# Patient Record
Sex: Male | Born: 1975 | Race: Black or African American | Hispanic: No | Marital: Single | Smoking: Former smoker
Health system: Southern US, Community
[De-identification: ages and names within clinical notes are randomized; demographics above are authoritative.]

## PROBLEM LIST (undated history)

## (undated) DIAGNOSIS — I10 Essential (primary) hypertension: Secondary | ICD-10-CM

---

## 2004-12-20 ENCOUNTER — Emergency Department (HOSPITAL_COMMUNITY): Admission: EM | Admit: 2004-12-20 | Discharge: 2004-12-20 | Payer: Self-pay | Admitting: Emergency Medicine

## 2006-08-14 ENCOUNTER — Emergency Department (HOSPITAL_COMMUNITY): Admission: EM | Admit: 2006-08-14 | Discharge: 2006-08-14 | Payer: Self-pay | Admitting: Emergency Medicine

## 2006-08-26 ENCOUNTER — Emergency Department (HOSPITAL_COMMUNITY): Admission: EM | Admit: 2006-08-26 | Discharge: 2006-08-26 | Payer: Self-pay | Admitting: Family Medicine

## 2009-11-12 ENCOUNTER — Emergency Department (HOSPITAL_COMMUNITY): Admission: EM | Admit: 2009-11-12 | Discharge: 2009-11-12 | Payer: Self-pay | Admitting: Family Medicine

## 2010-10-02 ENCOUNTER — Inpatient Hospital Stay (INDEPENDENT_AMBULATORY_CARE_PROVIDER_SITE_OTHER)
Admission: RE | Admit: 2010-10-02 | Discharge: 2010-10-02 | Disposition: A | Discharge status: Home / Self Care | Payer: Self-pay | Source: Ambulatory Visit | Attending: Emergency Medicine | Admitting: Emergency Medicine

## 2010-10-02 DIAGNOSIS — L988 Other specified disorders of the skin and subcutaneous tissue: Secondary | ICD-10-CM

## 2010-12-31 ENCOUNTER — Inpatient Hospital Stay (INDEPENDENT_AMBULATORY_CARE_PROVIDER_SITE_OTHER)
Admission: RE | Admit: 2010-12-31 | Discharge: 2010-12-31 | Disposition: A | Discharge status: Home / Self Care | Payer: Self-pay | Source: Ambulatory Visit | Attending: Family Medicine | Admitting: Family Medicine

## 2010-12-31 DIAGNOSIS — L989 Disorder of the skin and subcutaneous tissue, unspecified: Secondary | ICD-10-CM

## 2010-12-31 DIAGNOSIS — I10 Essential (primary) hypertension: Secondary | ICD-10-CM

## 2010-12-31 LAB — POCT URINALYSIS DIP (DEVICE)
Glucose, UA: NEGATIVE mg/dL
Hgb urine dipstick: NEGATIVE
Ketones, ur: NEGATIVE mg/dL
Nitrite: NEGATIVE
Protein, ur: 30 mg/dL — AB
Specific Gravity, Urine: 1.025 (ref 1.005–1.030)

## 2010-12-31 LAB — POCT I-STAT, CHEM 8
BUN: 10 mg/dL (ref 6–23)
Calcium, Ion: 1.18 mmol/L (ref 1.12–1.32)
HCT: 52 % (ref 39.0–52.0)
Hemoglobin: 17.7 g/dL — ABNORMAL HIGH (ref 13.0–17.0)
Sodium: 140 mEq/L (ref 135–145)

## 2012-09-17 ENCOUNTER — Emergency Department (HOSPITAL_COMMUNITY): Payer: Self-pay

## 2012-09-17 ENCOUNTER — Observation Stay (HOSPITAL_COMMUNITY)
Admission: EM | Admit: 2012-09-17 | Discharge: 2012-09-19 | Disposition: A | Discharge status: Home / Self Care | Payer: Self-pay | Attending: Internal Medicine | Admitting: Internal Medicine

## 2012-09-17 DIAGNOSIS — I160 Hypertensive urgency: Secondary | ICD-10-CM | POA: Diagnosis present

## 2012-09-17 DIAGNOSIS — R079 Chest pain, unspecified: Principal | ICD-10-CM | POA: Diagnosis present

## 2012-09-17 DIAGNOSIS — F17200 Nicotine dependence, unspecified, uncomplicated: Secondary | ICD-10-CM | POA: Insufficient documentation

## 2012-09-17 DIAGNOSIS — R9431 Abnormal electrocardiogram [ECG] [EKG]: Secondary | ICD-10-CM | POA: Insufficient documentation

## 2012-09-17 DIAGNOSIS — I10 Essential (primary) hypertension: Secondary | ICD-10-CM | POA: Insufficient documentation

## 2012-09-17 HISTORY — DX: Essential (primary) hypertension: I10

## 2012-09-17 LAB — BASIC METABOLIC PANEL
Calcium: 9.8 mg/dL (ref 8.4–10.5)
Creatinine, Ser: 1.08 mg/dL (ref 0.50–1.35)
GFR calc Af Amer: >90 mL/min (ref >90)
GFR calc non Af Amer: 88 mL/min — ABNORMAL LOW (ref >90)
Potassium: 3.6 mEq/L (ref 3.5–5.1)
Sodium: 137 mEq/L (ref 135–145)

## 2012-09-17 LAB — CBC
HCT: 43.6 % (ref 39.0–52.0)
MCV: 65.4 fL — ABNORMAL LOW (ref 78.0–100.0)
RDW: 14.6 % (ref 11.5–15.5)

## 2012-09-17 MED ORDER — ASPIRIN 81 MG PO CHEW
324.0000 mg | CHEWABLE_TABLET | Freq: Once | ORAL | Status: DC
  Filled 2012-09-17: qty 4

## 2012-09-17 MED ORDER — ASPIRIN 81 MG PO CHEW
CHEWABLE_TABLET | ORAL | Status: AC
  Administered 2012-09-17: 324 mg
  Filled 2012-09-17: qty 4

## 2012-09-17 MED ORDER — NITROGLYCERIN IN D5W 200-5 MCG/ML-% IV SOLN: 5.0000 ug/min | INTRAVENOUS | Status: DC

## 2012-09-17 MED ORDER — NITROGLYCERIN 0.4 MG SL SUBL
0.4000 mg | SUBLINGUAL_TABLET | SUBLINGUAL | Status: DC | PRN
  Administered 2012-09-18: 0.4 mg via SUBLINGUAL
  Filled 2012-09-17: qty 25

## 2012-09-17 NOTE — ED Notes (Signed)
Gave pt a gown to get into. Pt placed on the cardiac monitor, bp cuff in place and o2 senser on

## 2012-09-17 NOTE — ED Notes (Signed)
Pt c/o intermittent CP since waking this morning with radiation down left arm. Pain exacerbated by nothing and relieved by nothing. Pt states he took his BP today and it was 240/160

## 2012-09-17 NOTE — ED Provider Notes (Signed)
History     CSN: [id number]  Arrival date & time 09/17/12  2053   First MD Initiated Contact with Patient 09/17/12 2259      Chief Complaint  Patient presents with  . Chest Pain    (Consider location/radiation/quality/duration/timing/severity/associated sxs/prior treatment) HPI Hx per PT - At home tonight developed L sided CP, mo in severity. Some radiation to Left arm. He checked his BP and it was 220/140 so he presented to the ER. No diaphoresis. No N/V. No trauma, cough, SOB, leg swelling.   Previously prescribed HCTZ for HTN but once Rx ran out he never took it again. He is scheduled to establish care with DR Particia Lather on Monday morning. No h/o same, no h/o CAD, DM or HLD.   No past medical history on file.  No past surgical history on file.  No family history on file.  History  Substance Use Topics  . Smoking status: Not on file  . Smokeless tobacco: Not on file  . Alcohol Use: Not on file      Review of Systems  Constitutional: Negative for fever and chills.  HENT: Negative for neck pain and neck stiffness.   Eyes: Negative for pain.  Respiratory: Negative for shortness of breath.   Cardiovascular: Positive for chest pain.  Gastrointestinal: Negative for abdominal pain.  Genitourinary: Negative for dysuria.  Musculoskeletal: Negative for back pain.  Skin: Negative for rash.  Neurological: Negative for headaches.  All other systems reviewed and are negative.    Allergies  Review of patient's allergies indicates no known allergies.  Home Medications   Current Outpatient Rx  Name  Route  Sig  Dispense  Refill  . aspirin 81 MG tablet   Oral   Take 81 mg by mouth daily.           BP 173/107  Pulse 73  Temp(Src) 99.5 F (37.5 C) (Oral)  Resp 20  SpO2 98%  Physical Exam  Constitutional: He is oriented to person, place, and time. He appears well-developed and well-nourished.  HENT:  Head: Normocephalic and atraumatic.  Eyes: Conjunctivae and EOM  are normal. Pupils are equal, round, and reactive to light.  Neck: Trachea normal. Neck supple. No thyromegaly present.  Cardiovascular: Normal rate, regular rhythm, S1 normal, S2 normal and normal pulses.     No systolic murmur is present   No diastolic murmur is present  Pulses:      Radial pulses are 2+ on the right side, and 2+ on the left side.  Pulmonary/Chest: Effort normal and breath sounds normal. He has no wheezes. He has no rhonchi. He has no rales. He exhibits no tenderness.  Abdominal: Soft. Normal appearance and bowel sounds are normal. There is no tenderness. There is no CVA tenderness and negative Murphy's sign.  Musculoskeletal:  BLE:s Calves nontender, no cords or erythema, negative Homans sign  Neurological: He is alert and oriented to person, place, and time. He has normal strength. No cranial nerve deficit or sensory deficit. GCS eye subscore is 4. GCS verbal subscore is 5. GCS motor subscore is 6.  Skin: Skin is warm and dry. No rash noted. He is not diaphoretic.  Psychiatric: His speech is normal.  Cooperative and appropriate    ED Course  Procedures (including critical care time)  Results for orders placed during the hospital encounter of 09/17/12  CBC      Result Value Range   WBC 8.1  4.0 - 10.5 K/uL   RBC 6.67 (*)  4.22 - 5.81 MIL/uL   Hemoglobin 14.5  13.0 - 17.0 g/dL   HCT 16.1  09.6 - 04.5 %   MCV 65.4 (*) 78.0 - 100.0 fL   MCH 21.7 (*) 26.0 - 34.0 pg   MCHC 33.3  30.0 - 36.0 g/dL   RDW 40.9  81.1 - 91.4 %   Platelets 161  150 - 400 K/uL  BASIC METABOLIC PANEL      Result Value Range   Sodium 137  135 - 145 mEq/L   Potassium 3.6  3.5 - 5.1 mEq/L   Chloride 99  96 - 112 mEq/L   CO2 26  19 - 32 mEq/L   Glucose, Bld 116 (*) 70 - 99 mg/dL   BUN 11  6 - 23 mg/dL   Creatinine, Ser 7.82  0.50 - 1.35 mg/dL   Calcium 9.8  8.4 - 95.6 mg/dL   GFR calc non Af Amer 88 (*) >90 mL/min   GFR calc Af Amer >90  >90 mL/min  POCT I-STAT TROPONIN I      Result  Value Range   Troponin i, poc 0.01  0.00 - 0.08 ng/mL   Comment 3            Dg Chest 2 View  09/17/2012   *RADIOLOGY REPORT*  Clinical Data: Chest pain.  Hypertension.  Tobacco use.  CHEST - 2 VIEW  Comparison: 12/20/2004  Findings: Cardiac and mediastinal contours appear normal.  The lungs appear clear.  No pleural effusion is identified.  IMPRESSION:  No significant abnormality identified.   Original Report Authenticated By: Gaylyn Rong, M.D.     Date: 09/17/2012  Rate: 86  Rhythm: normal sinus rhythm  QRS Axis: normal  Intervals: normal  ST/T Wave abnormalities: nonspecific ST/T changes  Conduction Disutrbances:none  Narrative Interpretation:   Old EKG Reviewed: none available  ASA PTA NTG  11:44 PM elevated BP, CP, ECG changes - MED consulted, d/w Dr Adela Glimpse - will see in the ER  MDM  CP/ HTN  ECG, labs, CXR  NTG. CP resolved in ED  MED admit       Sunnie Nielsen, MD 09/18/12 [phone]

## 2012-09-18 ENCOUNTER — Encounter (HOSPITAL_COMMUNITY): Payer: Self-pay | Admitting: Internal Medicine

## 2012-09-18 DIAGNOSIS — I10 Essential (primary) hypertension: Secondary | ICD-10-CM

## 2012-09-18 DIAGNOSIS — R079 Chest pain, unspecified: Secondary | ICD-10-CM

## 2012-09-18 DIAGNOSIS — I160 Hypertensive urgency: Secondary | ICD-10-CM | POA: Diagnosis present

## 2012-09-18 DIAGNOSIS — R072 Precordial pain: Secondary | ICD-10-CM

## 2012-09-18 LAB — RAPID URINE DRUG SCREEN, HOSP PERFORMED
Amphetamines: NOT DETECTED
Barbiturates: NOT DETECTED
Opiates: NOT DETECTED
Tetrahydrocannabinol: POSITIVE — AB

## 2012-09-18 LAB — TSH: TSH: 2.518 u[IU]/mL (ref 0.350–4.500)

## 2012-09-18 LAB — HEMOGLOBIN A1C
Hgb A1c MFr Bld: 6.3 % — ABNORMAL HIGH (ref <5.7)
Mean Plasma Glucose: 134 mg/dL — ABNORMAL HIGH (ref <117)

## 2012-09-18 LAB — COMPREHENSIVE METABOLIC PANEL
ALT: 58 U/L — ABNORMAL HIGH (ref 0–53)
AST: 75 U/L — ABNORMAL HIGH (ref 0–37)
Albumin: 3.6 g/dL (ref 3.5–5.2)
CO2: 26 mEq/L (ref 19–32)
Calcium: 9.3 mg/dL (ref 8.4–10.5)
Creatinine, Ser: 0.95 mg/dL (ref 0.50–1.35)
GFR calc non Af Amer: >90 mL/min (ref >90)
Sodium: 138 mEq/L (ref 135–145)

## 2012-09-18 LAB — LIPID PANEL
Cholesterol: 191 mg/dL (ref 0–200)
HDL: 37 mg/dL — ABNORMAL LOW (ref >39)
Total CHOL/HDL Ratio: 5.2 RATIO
Triglycerides: 198 mg/dL — ABNORMAL HIGH (ref <150)

## 2012-09-18 LAB — CBC
HCT: 41.6 % (ref 39.0–52.0)
Hemoglobin: 13.5 g/dL (ref 13.0–17.0)
MCH: 21.1 pg — ABNORMAL LOW (ref 26.0–34.0)
MCHC: 32.5 g/dL (ref 30.0–36.0)
MCV: 65.1 fL — ABNORMAL LOW (ref 78.0–100.0)
RBC: 6.39 MIL/uL — ABNORMAL HIGH (ref 4.22–5.81)

## 2012-09-18 LAB — TROPONIN I: Troponin I: <0.3 ng/mL (ref <0.30)

## 2012-09-18 LAB — PHOSPHORUS: Phosphorus: 5.1 mg/dL — ABNORMAL HIGH (ref 2.3–4.6)

## 2012-09-18 LAB — POCT I-STAT TROPONIN I

## 2012-09-18 MED ORDER — ACETAMINOPHEN 650 MG RE SUPP: 650.0000 mg | Freq: Four times a day (QID) | RECTAL | Status: DC | PRN

## 2012-09-18 MED ORDER — HYDROCHLOROTHIAZIDE 25 MG PO TABS
25.0000 mg | ORAL_TABLET | Freq: Every day | ORAL | Status: DC
  Administered 2012-09-18 – 2012-09-19 (×2): 25 mg via ORAL
  Filled 2012-09-18 (×3): qty 1

## 2012-09-18 MED ORDER — ACETAMINOPHEN 325 MG PO TABS: 650.0000 mg | ORAL_TABLET | Freq: Four times a day (QID) | ORAL | Status: DC | PRN

## 2012-09-18 MED ORDER — ENOXAPARIN SODIUM 40 MG/0.4ML ~~LOC~~ SOLN: 40.0000 mg | SUBCUTANEOUS | Status: DC

## 2012-09-18 MED ORDER — SODIUM CHLORIDE 0.9 % IJ SOLN
3.0000 mL | Freq: Two times a day (BID) | INTRAMUSCULAR | Status: DC
  Administered 2012-09-18 (×2): 3 mL via INTRAVENOUS

## 2012-09-18 MED ORDER — SODIUM CHLORIDE 0.9 % IJ SOLN: 3.0000 mL | INTRAMUSCULAR | Status: DC | PRN

## 2012-09-18 MED ORDER — ONDANSETRON HCL 4 MG PO TABS: 4.0000 mg | ORAL_TABLET | Freq: Four times a day (QID) | ORAL | Status: DC | PRN

## 2012-09-18 MED ORDER — HYDRALAZINE HCL 20 MG/ML IJ SOLN: 10.0000 mg | INTRAMUSCULAR | Status: DC | PRN

## 2012-09-18 MED ORDER — ASPIRIN 81 MG PO CHEW
81.0000 mg | CHEWABLE_TABLET | Freq: Every day | ORAL | Status: DC
  Administered 2012-09-18 – 2012-09-19 (×2): 81 mg via ORAL
  Filled 2012-09-18 (×2): qty 1

## 2012-09-18 MED ORDER — SODIUM CHLORIDE 0.9 % IV SOLN: 250.0000 mL | INTRAVENOUS | Status: DC | PRN

## 2012-09-18 MED ORDER — DOCUSATE SODIUM 100 MG PO CAPS
100.0000 mg | ORAL_CAPSULE | Freq: Two times a day (BID) | ORAL | Status: DC
  Administered 2012-09-18 (×2): 100 mg via ORAL
  Filled 2012-09-18 (×4): qty 1

## 2012-09-18 MED ORDER — MORPHINE SULFATE 4 MG/ML IJ SOLN: 4.0000 mg | INTRAMUSCULAR | Status: DC | PRN

## 2012-09-18 MED ORDER — ASPIRIN 81 MG PO CHEW: 324.0000 mg | CHEWABLE_TABLET | Freq: Once | ORAL | Status: AC

## 2012-09-18 MED ORDER — PANTOPRAZOLE SODIUM 40 MG PO TBEC
40.0000 mg | DELAYED_RELEASE_TABLET | Freq: Every day | ORAL | Status: DC
  Administered 2012-09-18: 40 mg via ORAL
  Filled 2012-09-18: qty 1

## 2012-09-18 MED ORDER — ENOXAPARIN SODIUM 80 MG/0.8ML ~~LOC~~ SOLN
70.0000 mg | SUBCUTANEOUS | Status: DC
  Administered 2012-09-18: 70 mg via SUBCUTANEOUS
  Filled 2012-09-18 (×2): qty 0.8

## 2012-09-18 MED ORDER — ONDANSETRON HCL 4 MG/2ML IJ SOLN: 4.0000 mg | Freq: Four times a day (QID) | INTRAMUSCULAR | Status: DC | PRN

## 2012-09-18 MED ORDER — ASPIRIN 81 MG PO TABS: 81.0000 mg | ORAL_TABLET | Freq: Every day | ORAL | Status: DC

## 2012-09-18 MED ORDER — SODIUM CHLORIDE 0.9 % IJ SOLN: 3.0000 mL | Freq: Two times a day (BID) | INTRAMUSCULAR | Status: DC

## 2012-09-18 MED ORDER — HYDROCODONE-ACETAMINOPHEN 5-325 MG PO TABS: 1.0000 | ORAL_TABLET | ORAL | Status: DC | PRN

## 2012-09-18 NOTE — Progress Notes (Signed)
[facility] PROGRESS NOTE  Assessment/Plan: Chest pain: - cardiac markers negative x1 - echo pending. - EKG: SR LVH. - check UDS   Hypertensive urgency: - now improved cont to monitor.     Code Status: full Family Communication: wife and mother  Disposition Plan: home in am   Consultants:  none  Procedures:  Echo 5.18.2014  Antibiotics:  None HPI/Subjective: No complains  Objective: Filed Vitals:   09/18/12 0250 09/18/12 0338 09/18/12 0500 09/18/12 0549  BP: 173/114 172/102 131/71 127/72  Pulse: 68   58  Temp: 98.9 F (37.2 C)   98.6 F (37 C)  TempSrc: Oral   Oral  Resp: 20   18  Height: 6\' 2"  (1.88 m)     Weight: 143.836 kg (317 lb 1.6 oz)     SpO2: 96%   98%    Intake/Output Summary (Last 24 hours) at 09/18/12 1059 Last data filed at 09/18/12 1610  Gross per 24 hour  Intake      0 ml  Output      0 ml  Net      0 ml   Filed Weights   09/18/12 0250  Weight: 143.836 kg (317 lb 1.6 oz)    Exam:  General: Alert, awake, oriented x3, in no acute distress.  HEENT: No bruits, no goiter.  Heart: Regular rate and rhythm, without murmurs, rubs, gallops.  Lungs: Good air movement, clear to auscultation. Abdomen: Soft, nontender, nondistended, positive bowel sounds.  Neuro: Grossly intact, nonfocal.   Data Reviewed: Basic Metabolic Panel:  Recent Labs Lab 09/17/12 2123 09/18/12 0520  NA 137 138  K 3.6 3.7  CL 99 99  CO2 26 26  GLUCOSE 116* 111*  BUN 11 11  CREATININE 1.08 0.95  CALCIUM 9.8 9.3  MG  --  1.8  PHOS  --  5.1*   Liver Function Tests:  Recent Labs Lab 09/18/12 0520  AST 75*  ALT 58*  ALKPHOS 41  BILITOT 0.4  PROT 7.0  ALBUMIN 3.6   No results found for this basename: LIPASE, AMYLASE,  in the last 168 hours No results found for this basename: AMMONIA,  in the last 168 hours CBC:  Recent Labs Lab 09/17/12 2123 09/18/12 0520  WBC 8.1 7.4  HGB 14.5 13.5  HCT 43.6 41.6  MCV 65.4* 65.1*  PLT 161 148*    Cardiac Enzymes:  Recent Labs Lab 09/18/12 0500 09/18/12 0925  TROPONINI <0.30 <0.30   BNP (last 3 results) No results found for this basename: PROBNP,  in the last 8760 hours CBG: No results found for this basename: GLUCAP,  in the last 168 hours  No results found for this or any previous visit (from the past 240 hour(s)).   Studies: Dg Chest 2 View  09/17/2012   *RADIOLOGY REPORT*  Clinical Data: Chest pain.  Hypertension.  Tobacco use.  CHEST - 2 VIEW  Comparison: 12/20/2004  Findings: Cardiac and mediastinal contours appear normal.  The lungs appear clear.  No pleural effusion is identified.  IMPRESSION:  No significant abnormality identified.   Original Report Authenticated By: Gaylyn Rong, M.D.    Scheduled Meds: . aspirin  324 mg Oral Once  . aspirin  81 mg Oral Daily  . docusate sodium  100 mg Oral BID  . enoxaparin (LOVENOX) injection  70 mg Subcutaneous Q24H  . hydrochlorothiazide  25 mg Oral Daily  . pantoprazole  40 mg Oral Q1200  . sodium chloride  3 mL Intravenous Q12H  .  sodium chloride  3 mL Intravenous Q12H   Continuous Infusions:    Marinda Elk  [facility] Pager [phone]. If 8PM-8AM, please contact night-coverage at [email or web address], password [facility] 09/18/2012, 10:59 AM  LOS: 1 day

## 2012-09-18 NOTE — Progress Notes (Signed)
Utilization review completed.  

## 2012-09-18 NOTE — Care Management Note (Signed)
    Page 1 of 1   09/18/2012     11:32:03 AM   CARE MANAGEMENT NOTE 09/18/2012  Patient:  ALEJANDRO, GAMEL   Account Number:  [id number]  Date Initiated:  09/18/2012  Documentation initiated by:  Donn Pierini  Subjective/Objective Assessment:   Pt admitted with chest pain     Action/Plan:   PTA pt lived at home   Anticipated DC Date:  09/18/2012   Anticipated DC Plan:  HOME/SELF CARE      DC Planning Services  CM consult  PCP issues      Choice offered to / List presented to:             Status of service:  Completed, signed off Medicare Important Message given?   (If response is "NO", the following Medicare IM given date fields will be blank) Date Medicare IM given:   Date Additional Medicare IM given:    Discharge Disposition:  HOME/SELF CARE  Per UR Regulation:  Reviewed for med. necessity/level of care/duration of stay  If discussed at Long Length of Stay Meetings, dates discussed:    Comments:  09/18/12- 1100- Donn Pierini RN, BSN [phone] Received referral for PCP- spoke with pt at bedside regarding PCP needs. Pt states he does not have insurance- list of Primary Care Resources in community given to pt- info given on clinics including new [facility] and the [facility]- pt to f/u in establishing a PCP.

## 2012-09-18 NOTE — Progress Notes (Signed)
  Echocardiogram 2D Echocardiogram has been performed.  Spencer Reid 09/18/2012, 1:05 PM 

## 2012-09-18 NOTE — H&P (Signed)
PCP: none  Chief Complaint:  Chest pain  HPI: Spencer Reid is a 37 y.o. male   has a past medical history of Hypertension.   Presented with  Since this Am he have had some chest discomfort felt like indigestion all day. This has progressed in the evening and he presented to ER. He was noted to have severe HTN with SBP in 240's range. Patient was given SL nitro and his CP has resolved and his blood pressure came down to 160/90.   Last night he had red bull which is unusual for him. He has hx of HTN diagnosed 1 year ago and was given a prescription for HCTZ but never followed up and never refilled it.   He checks his blood pressure occasionally but he is not sure what it has been like at home.   Currently he is chest pain free. Hospitalst was called for an admssion.   Review of Systems:    Pertinent positives include: chest pain, palpitations.fatigue,  Constitutional:  No weight loss, night sweats, Fevers, chills,  weight loss  HEENT:  No headaches, Difficulty swallowing,Tooth/dental problems,Sore throat,  No sneezing, itching, ear ache, nasal congestion, post nasal drip,  Cardio-vascular:  No Orthopnea, PND, anasarca, dizziness, no Bilateral lower extremity swelling  GI:  No heartburn, indigestion, abdominal pain, nausea, vomiting, diarrhea, change in bowel habits, loss of appetite, melena, blood in stool, hematemesis Resp:  no shortness of breath at rest. No dyspnea on exertion, No excess mucus, no productive cough, No non-productive cough, No coughing up of blood.No change in color of mucus.No wheezing. Skin:  no rash or lesions. No jaundice GU:  no dysuria, change in color of urine, no urgency or frequency. No straining to urinate.  No flank pain.  Musculoskeletal:  No joint pain or no joint swelling. No decreased range of motion. No back pain.  Psych:  No change in mood or affect. No depression or anxiety. No memory loss.  Neuro: no localizing neurological complaints, no  tingling, no weakness, no double vision, no gait abnormality, no slurred speech, no confusion  Otherwise ROS are negative except for above, 10 systems were reviewed  Past Medical History: Past Medical History  Diagnosis Date  . Hypertension    History reviewed. No pertinent past surgical history.   Medications: Prior to Admission medications   Medication Sig Start Date End Date Taking? Authorizing Provider  aspirin 81 MG tablet Take 81 mg by mouth daily.   Yes Historical Provider, MD    Allergies:  No Known Allergies  Social History:  Ambulatory   independently   Lives at   Home with family   reports that he has quit smoking. He does not have any smokeless tobacco history on file. He reports that  drinks alcohol. He reports that he uses illicit drugs (Marijuana).   Family History: family history includes Alcohol abuse in his father; Cerebral palsy in his son; and Hypertension in his mother.    Physical Exam: Patient Vitals for the past 24 hrs:  BP Temp Temp src Pulse Resp SpO2  09/18/12 0030 162/104 mmHg - - 70 26 98 %  09/18/12 0015 169/103 mmHg - - 68 23 98 %  09/18/12 0000 170/96 mmHg - - 78 19 98 %  09/17/12 2345 158/103 mmHg - - 74 17 99 %  09/17/12 2330 155/92 mmHg - - 69 16 100 %  09/17/12 2319 177/103 mmHg - - 74 23 100 %  09/17/12 2315 154/95 mmHg - - 73  22 99 %  09/17/12 2110 173/107 mmHg 99.5 F (37.5 C) Oral 73 20 98 %    1. General:  in No Acute distress 2. Psychological: Alert and  Oriented 3. Head/ENT:   Moist  Mucous Membranes                          Head Non traumatic, neck supple                          Normal   Dentition 4. SKIN: normal  Skin turgor,  Skin clean Dry and intact no rash 5. Heart: Regular rate and rhythm no Murmur, Rub or gallop 6. Lungs: Clear to auscultation bilaterally, no wheezes or crackles   7. Abdomen: Soft, non-tender, Non distended 8. Lower extremities: no clubbing, cyanosis, or edema 9. Neurologically Grossly intact,  moving all 4 extremities equally 10. MSK: Normal range of motion  body mass index is unknown because there is no height or weight on file.   Labs on Admission:   Recent Labs  09/17/12 2123  NA 137  K 3.6  CL 99  CO2 26  GLUCOSE 116*  BUN 11  CREATININE 1.08  CALCIUM 9.8   No results found for this basename: AST, ALT, ALKPHOS, BILITOT, PROT, ALBUMIN,  in the last 72 hours No results found for this basename: LIPASE, AMYLASE,  in the last 72 hours  Recent Labs  09/17/12 2123  WBC 8.1  HGB 14.5  HCT 43.6  MCV 65.4*  PLT 161   No results found for this basename: CKTOTAL, CKMB, CKMBINDEX, TROPONINI,  in the last 72 hours No results found for this basename: TSH, T4TOTAL, FREET3, T3FREE, THYROIDAB,  in the last 72 hours No results found for this basename: VITAMINB12, FOLATE, FERRITIN, TIBC, IRON, RETICCTPCT,  in the last 72 hours No results found for this basename: HGBA1C    CrCl is unknown because there is no height on file for the current visit. ABG    Component Value Date/Time   TCO2 25 12/31/2010 1059     No results found for this basename: DDIMER     Other results:  I have pearsonaly reviewed this: ECG REPORT  Rate: 86   Rhythm: NSR ST&T Change: early repolarization   Cultures: No results found for this basename: sdes, specrequest, cult, reptstatus       Radiological Exams on Admission: Dg Chest 2 View  09/17/2012   *RADIOLOGY REPORT*  Clinical Data: Chest pain.  Hypertension.  Tobacco use.  CHEST - 2 VIEW  Comparison: 12/20/2004  Findings: Cardiac and mediastinal contours appear normal.  The lungs appear clear.  No pleural effusion is identified.  IMPRESSION:  No significant abnormality identified.   Original Report Authenticated By: Gaylyn Rong, M.D.    Chart has been reviewed  Assessment/Plan  37 yo M w hx of uncontrolled HTN here with Chest pain in the setting of hypertensive urgency now improved.   Present on Admission:  . Chest pain -  - given risk factors will admit, monitor on telemetry, cycle cardiac enzymes, obtain serial ECG. Further risk stratify with lipid panel, hgA1C, obtain TSH. Make sure patient is on Aspirin. Further treatment based on the currently pending results. Obtain Echo to evaluate for any wall motion abnormality. Will make NPO in case he will need any studies done.  . Hypertensive urgency - Blood pressure now improved, he needs to be started on antihypertensive  and titrated as an outpatient. For now restart HCTZ but likely will need to be on more than one medication.    Prophylaxis: Lovenox, Protonix  CODE STATUS: FULL CODE  Other plan as per orders.  I have spent a total of 55 min on this admission  Veeda Virgo 09/18/2012, 1:05 AM

## 2012-09-19 MED ORDER — HYDROCHLOROTHIAZIDE 25 MG PO TABS: 25.0000 mg | ORAL_TABLET | Freq: Every day | ORAL | Status: DC

## 2012-09-19 NOTE — Progress Notes (Signed)
dsicharged to home with significant other ambulatory to priate vehicle to home, provided info on hy[pertesnsion and verbalixes understanding of dc instructions and followup exhibited by teachback method, Berle Mull RN

## 2012-09-19 NOTE — Discharge Summary (Signed)
Physician Discharge Summary  Spencer Reid NWG:[id number] DOB: 1975-10-13 DOA: 09/17/2012  PCP: No primary provider on file.  Admit date: 09/17/2012 Discharge date: 09/19/2012  Time spent: 35 minutes  Recommendations for Outpatient Follow-up:  1. Follow up with PCP check BP and titrate Bp as tolerate it.  Discharge Diagnoses:  Active Problems:   Chest pain   Hypertensive urgency   Discharge Condition: stable  Diet recommendation: heart healthy diet  Filed Weights   09/18/12 0250  Weight: 143.836 kg (317 lb 1.6 oz)    History of present illness:  37 y.o. male has a past medical history of Hypertension. Presented with Since this Am he have had some chest discomfort felt like indigestion all day. This has progressed in the evening and he presented to ER. He was noted to have severe HTN with SBP in 240's range. Patient was given SL nitro and his CP has resolved and his blood pressure came down to 160/90. Last night he had red bull which is unusual for him. He has hx of HTN diagnosed 1 year ago and was given a prescription for HCTZ but never followed up and never refilled it.  He checks his blood pressure occasionally but he is not sure what it has been like at home.  Currently he is chest pain free. Hospitalst was called for an admssion.    Hospital Course:  Chest pain:  - cardiac markers negative x1  - echo follow up as an outpatient. - EKG: SR LVH.  - UDS: Cannabis.  Hypertensive urgency:  - now improved cont to monitor.   Procedures:  Echo 5.18.2014  Consultations:  none  Discharge Exam: Filed Vitals:   09/18/12 0549 09/18/12 1402 09/18/12 2100 09/19/12 0500  BP: 127/72 156/83 158/99 163/97  Pulse: 58 62 62 98  Temp: 98.6 F (37 C) 98.7 F (37.1 C) 97.7 F (36.5 C) 98 F (36.7 C)  TempSrc: Oral Oral    Resp: 18 18 20 20   Height:      Weight:      SpO2: 98% 99% 99% 99%    General: A&O x3 Cardiovascular: RRR Respiratory: good air movement CTA  B/L  Discharge Instructions  Discharge Orders   Future Orders Complete By Expires     Diet - low sodium heart healthy  As directed     Increase activity slowly  As directed         Medication List    TAKE these medications       aspirin 81 MG tablet  Take 81 mg by mouth daily.     hydrochlorothiazide 25 MG tablet  Commonly known as:  HYDRODIURIL  Take 1 tablet (25 mg total) by mouth daily.       No Known Allergies    The results of significant diagnostics from this hospitalization (including imaging, microbiology, ancillary and laboratory) are listed below for reference.    Significant Diagnostic Studies: Dg Chest 2 View  09/17/2012   *RADIOLOGY REPORT*  Clinical Data: Chest pain.  Hypertension.  Tobacco use.  CHEST - 2 VIEW  Comparison: 12/20/2004  Findings: Cardiac and mediastinal contours appear normal.  The lungs appear clear.  No pleural effusion is identified.  IMPRESSION:  No significant abnormality identified.   Original Report Authenticated By: Gaylyn Rong, M.D.    Microbiology: No results found for this or any previous visit (from the past 240 hour(s)).   Labs: Basic Metabolic Panel:  Recent Labs Lab 09/17/12 2123 09/18/12 0520  NA 137 138  K 3.6 3.7  CL 99 99  CO2 26 26  GLUCOSE 116* 111*  BUN 11 11  CREATININE 1.08 0.95  CALCIUM 9.8 9.3  MG  --  1.8  PHOS  --  5.1*   Liver Function Tests:  Recent Labs Lab 09/18/12 0520  AST 75*  ALT 58*  ALKPHOS 41  BILITOT 0.4  PROT 7.0  ALBUMIN 3.6   No results found for this basename: LIPASE, AMYLASE,  in the last 168 hours No results found for this basename: AMMONIA,  in the last 168 hours CBC:  Recent Labs Lab 09/17/12 2123 09/18/12 0520  WBC 8.1 7.4  HGB 14.5 13.5  HCT 43.6 41.6  MCV 65.4* 65.1*  PLT 161 148*   Cardiac Enzymes:  Recent Labs Lab 09/18/12 0500 09/18/12 0925 09/18/12 1453  TROPONINI <0.30 <0.30 <0.30   BNP: BNP (last 3 results) No results found for this  basename: PROBNP,  in the last 8760 hours CBG: No results found for this basename: GLUCAP,  in the last 168 hours     Signed:  Marinda Elk  [facility] 09/19/2012, 9:41 AM

## 2012-11-09 ENCOUNTER — Encounter: Payer: Self-pay | Admitting: Internal Medicine

## 2012-11-09 ENCOUNTER — Ambulatory Visit: Payer: Self-pay | Attending: Family Medicine | Admitting: Internal Medicine

## 2012-11-09 VITALS — BP 163/117 | HR 65 | Temp 98.7°F | Resp 16 | Ht 74.0 in | Wt 303.6 lb

## 2012-11-09 DIAGNOSIS — N529 Male erectile dysfunction, unspecified: Secondary | ICD-10-CM | POA: Insufficient documentation

## 2012-11-09 DIAGNOSIS — Z79899 Other long term (current) drug therapy: Secondary | ICD-10-CM | POA: Insufficient documentation

## 2012-11-09 DIAGNOSIS — Z23 Encounter for immunization: Secondary | ICD-10-CM | POA: Insufficient documentation

## 2012-11-09 DIAGNOSIS — Z7982 Long term (current) use of aspirin: Secondary | ICD-10-CM | POA: Insufficient documentation

## 2012-11-09 DIAGNOSIS — I10 Essential (primary) hypertension: Secondary | ICD-10-CM | POA: Insufficient documentation

## 2012-11-09 LAB — LIPID PANEL
HDL: 34 mg/dL — ABNORMAL LOW (ref >39)
LDL Cholesterol: 137 mg/dL — ABNORMAL HIGH (ref 0–99)
Total CHOL/HDL Ratio: 5.7 Ratio
Triglycerides: 121 mg/dL (ref <150)
VLDL: 24 mg/dL (ref 0–40)

## 2012-11-09 LAB — HEMOGLOBIN A1C: Hgb A1c MFr Bld: 6.1 % — ABNORMAL HIGH (ref <5.7)

## 2012-11-09 MED ORDER — CLONIDINE HCL 0.1 MG PO TABS
0.2000 mg | ORAL_TABLET | ORAL | Status: AC
  Administered 2012-11-09: 0.2 mg via ORAL

## 2012-11-09 MED ORDER — METOPROLOL TARTRATE 50 MG PO TABS: 50.0000 mg | ORAL_TABLET | Freq: Two times a day (BID) | ORAL | Status: DC

## 2012-11-09 MED ORDER — CLONIDINE HCL 0.1 MG PO TABS: 0.2000 mg | ORAL_TABLET | Freq: Once | ORAL | Status: DC

## 2012-11-09 MED ORDER — CLONIDINE HCL 0.1 MG PO TABS
0.1000 mg | ORAL_TABLET | Freq: Once | ORAL | Status: AC
  Administered 2012-11-09: 0.1 mg via ORAL

## 2012-11-09 MED ORDER — SILDENAFIL CITRATE 100 MG PO TABS: 50.0000 mg | ORAL_TABLET | Freq: Every day | ORAL | Status: DC | PRN

## 2012-11-09 MED ORDER — AMLODIPINE BESYLATE 10 MG PO TABS: 10.0000 mg | ORAL_TABLET | Freq: Every day | ORAL | Status: DC

## 2012-11-09 NOTE — Progress Notes (Signed)
Pt here to establish care s/p hypertensive crisis 2 weeks ago with hospitalization. Taking HCTZ but states not keeping bp down. Denies CP or dizziness

## 2012-11-09 NOTE — Progress Notes (Signed)
Patient Demographics  Spencer Reid, is a 37 y.o. male  CSN: [id number]  MRN: [id number]  DOB - [birth year]  Admit Date - (Not on file)  Outpatient Primary MD for the patient is Standley Dakins, MD   With History of -  Past Medical History  Diagnosis Date  . Hypertension       History reviewed. No pertinent past surgical history.  in for   Chief Complaint  Patient presents with  . Establish Care    pt taking hctz but bp still high     HPI  Spencer Reid  is a 37 y.o. male, was recently admitted for chest pain secondary to hypertensive crisis, he ruled out for MI had stable echogram presents here to establish care. He has no subjective complaints currently except he has some erectile dysfunction which he experiences when he is with his girlfriend with whom he he's been living for the last 10 years. If he watches any new material or if he is in a new situation this problem does not exist.    Review of Systems    In addition to the HPI above,   No Fever-chills, No Headache, No changes with Vision or hearing, No problems swallowing food or Liquids, No Chest pain, Cough or Shortness of Breath, No Abdominal pain, No Nausea or Vommitting, Bowel movements are regular, No Blood in stool or Urine, No dysuria, problems with erection No new skin rashes or bruises, No new joints pains-aches,  No new weakness, tingling, numbness in any extremity, No recent weight gain or loss, No polyuria, polydypsia or polyphagia, No significant Mental Stressors.  A full 10 point Review of Systems was done, except as stated above, all other Review of Systems were negative.   Social History History  Substance Use Topics  . Smoking status: Former Games developer  . Smokeless tobacco: Not on file  . Alcohol Use: Yes     Comment: occasional      Family History Family History  Problem Relation Age of Onset  . Hypertension Mother   . Alcohol abuse Father   . Cerebral palsy Son        Prior to Admission medications   Medication Sig Start Date End Date Taking? Authorizing Provider  aspirin 81 MG tablet Take 81 mg by mouth daily.   Yes Historical Provider, MD  hydrochlorothiazide (HYDRODIURIL) 25 MG tablet Take 1 tablet (25 mg total) by mouth daily. 09/19/12  Yes Marinda Elk, MD  amLODipine (NORVASC) 10 MG tablet Take 1 tablet (10 mg total) by mouth daily. 11/09/12   Leroy Sea, MD  cloNIDine (CATAPRES) 0.1 MG tablet Take 2 tablets (0.2 mg total) by mouth once. 11/09/12   Leroy Sea, MD  metoprolol (LOPRESSOR) 50 MG tablet Take 1 tablet (50 mg total) by mouth 2 (two) times daily. 11/09/12   Leroy Sea, MD  sildenafil (VIAGRA) 100 MG tablet Take 0.5 tablets (50 mg total) by mouth daily as needed for erectile dysfunction. 11/09/12   Leroy Sea, MD    No Known Allergies  Physical Exam  Vitals  Blood pressure 163/117, pulse 65, temperature 98.7 F (37.1 C), temperature source Oral, resp. rate 16, height 6\' 2"  (1.88 m), weight 303 lb 9.6 oz (137.712 kg), SpO2 100.00%.   1. General [place] American male well built lying in bed in NAD,     2. Normal affect and insight, Not Suicidal or Homicidal, Awake Alert, Oriented X 3.  3. No F.N deficits, ALL  C.Nerves Intact, Strength 5/5 all 4 extremities, Sensation intact all 4 extremities, Plantars down going.  4. Ears and Eyes appear Normal, Conjunctivae clear, PERRLA. Moist Oral Mucosa.  5. Supple Neck, No JVD, No cervical lymphadenopathy appriciated, No Carotid Bruits.  6. Symmetrical Chest wall movement, Good air movement bilaterally, CTAB.  7. RRR, No Gallops, Rubs or Murmurs, No Parasternal Heave.  8. Positive Bowel Sounds, Abdomen Soft, Non tender, No organomegaly appriciated,No rebound -guarding or rigidity.  9.  No Cyanosis, Normal Skin Turgor, No Skin Rash or Bruise.  10. Good muscle tone,  joints appear normal , no effusions, Normal ROM.  11. No Palpable Lymph Nodes in Neck or  Axillae     Data Review  CBC No results found for this basename: WBC, HGB, HCT, PLT, MCV, MCH, MCHC, RDW, NEUTRABS, LYMPHSABS, MONOABS, EOSABS, BASOSABS, BANDABS, BANDSABD,  in the last 168 hours ------------------------------------------------------------------------------------------------------------------  Chemistries  No results found for this basename: NA, K, CL, CO2, GLUCOSE, BUN, CREATININE, GFRCGP, CALCIUM, MG, AST, ALT, ALKPHOS, BILITOT,  in the last 168 hours ------------------------------------------------------------------------------------------------------------------ estimated creatinine clearance is 160.3 ml/min (by C-G formula based on Cr of 0.95). ------------------------------------------------------------------------------------------------------------------ No results found for this basename: TSH, T4TOTAL, FREET3, T3FREE, THYROIDAB,  in the last 72 hours   Coagulation profile No results found for this basename: INR, PROTIME,  in the last 168 hours ------------------------------------------------------------------------------------------------------------------- No results found for this basename: DDIMER,  in the last 72 hours -------------------------------------------------------------------------------------------------------------------  Cardiac Enzymes No results found for this basename: CK, CKMB, TROPONINI, MYOGLOBIN,  in the last 168 hours ------------------------------------------------------------------------------------------------------------------ No components found with this basename: POCBNP,    ---------------------------------------------------------------------------------------------------------------  Urinalysis    Component Value Date/Time   LABSPEC 1.025 12/31/2010 1058   PHURINE 6.5 12/31/2010 1058   GLUCOSEU NEGATIVE 12/31/2010 1058   HGBUR NEGATIVE 12/31/2010 1058   BILIRUBINUR NEGATIVE 12/31/2010 1058   KETONESUR NEGATIVE 12/31/2010 1058    PROTEINUR 30* 12/31/2010 1058   UROBILINOGEN 0.2 12/31/2010 1058   NITRITE NEGATIVE 12/31/2010 1058   LEUKOCYTESUR NEGATIVE 12/31/2010 1058       Assessment and plan  HTN poor control. He is only on HCTZ, we will add Norvasc along with Lopressor and get him back in a month. He received 0.2 mg of Catapres stat. Back in a week for followup.   Erectile dysfunction. This is all psychological. Trial of Viagra. If persists outpatient urology followup .   Routine health maintenance.  Screening labs. CBC, BMP, and echo from recent admission stable, will order TSH, A1c, lipid panel  Immunizations tetanus shot given     Leroy Sea M.D on 11/09/2012 at 3:23 PM

## 2012-11-11 NOTE — Progress Notes (Signed)
Quick Note:  Patient to be called for another visit he has prediabetes ______

## 2012-11-15 ENCOUNTER — Ambulatory Visit: Payer: Self-pay

## 2012-11-21 ENCOUNTER — Ambulatory Visit: Payer: Self-pay | Attending: Family Medicine | Admitting: Internal Medicine

## 2012-11-21 ENCOUNTER — Encounter: Payer: Self-pay | Admitting: Internal Medicine

## 2012-11-21 VITALS — BP 143/95 | HR 60 | Temp 98.4°F | Resp 16 | Ht 72.0 in | Wt 300.0 lb

## 2012-11-21 DIAGNOSIS — Z7982 Long term (current) use of aspirin: Secondary | ICD-10-CM | POA: Insufficient documentation

## 2012-11-21 DIAGNOSIS — R7302 Impaired glucose tolerance (oral): Secondary | ICD-10-CM

## 2012-11-21 DIAGNOSIS — Z79899 Other long term (current) drug therapy: Secondary | ICD-10-CM | POA: Insufficient documentation

## 2012-11-21 DIAGNOSIS — R079 Chest pain, unspecified: Secondary | ICD-10-CM | POA: Insufficient documentation

## 2012-11-21 DIAGNOSIS — I10 Essential (primary) hypertension: Secondary | ICD-10-CM

## 2012-11-21 DIAGNOSIS — R7309 Other abnormal glucose: Secondary | ICD-10-CM | POA: Insufficient documentation

## 2012-11-21 MED ORDER — HYDROCHLOROTHIAZIDE 25 MG PO TABS: 25.0000 mg | ORAL_TABLET | Freq: Every day | ORAL | Status: DC

## 2012-11-21 NOTE — Patient Instructions (Signed)
Diabetes, Type 2, Am I At Risk? Diabetes is a lasting (chronic) disease. In type 2 diabetes, the pancreas does not make enough insulin, and the body does not respond normally to the insulin that is made. This type of diabetes was also previously called adult onset diabetes. About 90% of all those who have diabetes have type 2. It usually occurs after the age of 34, but can occur at any age.  People develop type 2 diabetes because they do not use insulin properly. Eventually, the pancreas cannot make enough insulin for the body's needs. Over time, the amount of glucose (sugar) in the blood increases. RISK FACTORS  Overweight  the more weight you have, the more resistant your cells become to insulin.  Family history  you are more likely to get diabetes if a parent or sibling has diabetes.  Race certain races get diabetes more.  African Americans.  American Indians.  Asian Americans.  Hispanics.  Pacific Islander.  Inactive exercise helps control weight and helps your cells be more sensitive to insulin.  Gestational diabetes  some women develop diabetes while they are pregnant. This goes away when they deliver. However, they are 50-60% more likely to develop type 2 diabetes at a later time.  Having a baby over 9 pounds  a sign that you may have had gestational diabetes.  Age the risk of diabetes goes up as you get older, especially after age 18.  High blood pressure (hypertension). SYMPTOMS Many people have no signs or symptoms. Symptoms can be so mild that you might not even notice them. Some of these signs are:  Increased thirst.  Increased hunger.  Tiredness (fatigue).  Increased urination, especially at night.  Weight loss.  Blurred vision.  Sores that do not heal. WHO SHOULD BE TESTED?  Anyone 25 years or older, especially if overweight, should consider getting tested.  If you are younger than 56, overweight, and have one or more of the risk factors, you should  consider getting tested. DIAGNOSIS  Fasting blood glucose (FBS). Usually, 2 are done.  FBS 101-125 mg/dl is considered pre-diabetes.  FBS 126 mg/dl or greater is considered diabetes.  2 hour Oral Glucose Tolerance Test (OGTT). This test is preformed by first having you not eat or drink for several hours. You are then given something sweet to drink and your blood glucose is measured fasting, at one hour and 2 hours. This test tells how well you are able to handle sugars or carbohydrates.  Fasting: 60-100 mg/dl.  1 hour: less than 200 mg/dl.  2 hours: less than 140 mg/dl.  A1c A1c is a blood glucose test that gives and average of your blood glucose over 3 months. It is the accepted method to use to diagnose diabetes.  A1c 5.7-6.4% is considered pre-diabetes.  A1c 6.5% or greater is considered diabetes. WHAT DOES IT MEAN TO HAVE PRE-DIABETES? Pre-diabetes means you are at risk for getting type 2 diabetes. Your blood glucose is higher than normal, but not yet high enough to diagnose diabetes. The good news is, if you have pre-diabetes you can reduce the risk of getting diabetes and even return to normal blood glucose levels. With modest weight loss and moderate physical activity, you can delay or prevent type 2 diabetes.  PREVENTION You cannot do anything about race, age or family history, but you can lower your chances of getting diabetes. You can:   Exercise regularly and be active.  Reduce fat and calorie intake.  Make wise food  choices as much as you can.  Reduce your intake of salt and alcohol.  Maintain a reasonable weight.  Keep blood pressure in an acceptable range. Take medication if needed.  Not smoke.  Maintain an acceptable cholesterol level (HDL, LDL, Triglycerides). Take medication if needed. DOING MY PART: GETTING STARTED Making big changes in your life is hard, especially if you are faced with more than one change. You can make it easier by taking these  steps:  Make a plan to change behavior.  Decide exactly what you will do and when you will do it.  Plan what you need to get ready.  Think about what might prevent you from reaching your goals.  Find family and friends who will support and encourage you.  Decide how you will reward yourself when you do what you have planned.  Your doctor, dietitian, or counselor can help you make a plan. HERE ARE SOME OF THE AREAS YOU MAY WISH TO CHANGE TO REDUCE YOUR RISK OF DIABETES. If you are overweight or obese, choose sensible ways to get in shape. Even small amounts of weight loss, like 5-10 pounds, can help reduce the effects of insulin resistance and help blood glucose control. Diet  Avoid crash diets. Instead, eat less of the foods you usually have. Limit the amount of fat you eat.  Increase your physical activity. Aim for at least 30 minutes of exercise most days of the week.  Set a reasonable weight-loss goal, such as losing 1 pound a week. Aim for a long-term goal of losing 5-7% of your total body weight.  Make wise food choices most of the time.  What you eat has a big impact on your health. By making wise food choices, you can help control your body weight, blood pressure, and cholesterol.  Take a hard look at the serving sizes of the foods you eat. Reduce serving sizes of meat, desserts, and foods high in fat. Increase your intake of fruits and vegetables.  Limit your fat intake to about 25% of your total calories. For example, if your food choices add up to about 2,000 calories a day, try to eat no more than 56 grams of fat. Your caregiver or a dietitian can help you figure out how much fat to have. You can check food labels for fat content too.  You may also want to reduce the number of calories you have each day.  Keep a food log. Write down what you eat, how much you eat, and anything else that helps keep you on track.  When you meet your goal, reward yourself with a nonfood  item or activity. Exercise  Be physically active every day.  Keep and exercise log. Write down what exercise you did, for how long, and anything else that keeps you on track.  Regular exercise (like brisk walking) tackles several risk factors at once. It helps you lose weight, it keeps your cholesterol and blood pressure under control, and it helps your body use insulin. People who are physically active for 30 minutes a day, 5 days a week, reduced their risk of type 2 diabetes. If you are not very active, you should start slowly at first. Talk with your caregiver first about what kinds of exercise would be safe for you. Make a plan to increase your activity level with the goal of being active for at least 30 minutes a day, most days of the week.  Choose activities you enjoy. Here are some ways to  work extra activity into your daily routine:  Take the stairs rather than an elevator or escalator.  Park at the far end of the lot and walk.  Get off the bus a few stops early and walk the rest of the way.  Walk or bicycle instead of drive whenever you can. Medications Some people need medication to help control their blood pressure or cholesterol levels. If you do, take your medicines as directed. Ask your caregiver whether there are any medicines you can take to prevent type 2 diabetes. Document Released: 04/23/2003 Document Revised: 07/13/2011 Document Reviewed: 01/16/2009 [facility] Patient Information 2014 [place], [place]. Diabetes and Exercise Regular exercise is important and can help:   Control blood glucose (sugar).  Decrease blood pressure.    Control blood lipids (cholesterol, triglycerides).  Improve overall health. BENEFITS FROM EXERCISE  Improved fitness.  Improved flexibility.  Improved endurance.  Increased bone density.  Weight control.  Increased muscle strength.  Decreased body fat.  Improvement of the body's use of insulin, a hormone.  Increased insulin  sensitivity.  Reduction of insulin needs.  Reduced stress and tension.  Helps you feel better. People with diabetes who add exercise to their lifestyle gain additional benefits, including:  Weight loss.  Reduced appetite.  Improvement of the body's use of blood glucose.  Decreased risk factors for heart disease:  Lowering of cholesterol and triglycerides.  Raising the level of good cholesterol (high-density lipoproteins, HDL).  Lowering blood sugar.  Decreased blood pressure. TYPE 1 DIABETES AND EXERCISE  Exercise will usually lower your blood glucose.  If blood glucose is greater than 240 mg/dl, check urine ketones. If ketones are present, do not exercise.  Location of the insulin injection sites may need to be adjusted with exercise. Avoid injecting insulin into areas of the body that will be exercised. For example, avoid injecting insulin into:  The arms when playing tennis.  The legs when jogging. For more information, discuss this with your caregiver.  Keep a record of:  Food intake.  Type and amount of exercise.  Expected peak times of insulin action.  Blood glucose levels. Do this before, during, and after exercise. Review your records with your caregiver. This will help you to develop guidelines for adjusting food intake and insulin amounts.  TYPE 2 DIABETES AND EXERCISE  Regular physical activity can help control blood glucose.  Exercise is important because it may:  Increase the body's sensitivity to insulin.  Improve blood glucose control.  Exercise reduces the risk of heart disease. It decreases serum cholesterol and triglycerides. It also lowers blood pressure.  Those who take insulin or oral hypoglycemic agents should watch for signs of hypoglycemia. These signs include dizziness, shaking, sweating, chills, and confusion.  Body water is lost during exercise. It must be replaced. This will help to avoid loss of body fluids (dehydration) or heat  stroke. Be sure to talk to your caregiver before starting an exercise program to make sure it is safe for you. Remember, any activity is better than none.  Document Released: 07/11/2003 Document Revised: 07/13/2011 Document Reviewed: 10/25/2008 [facility] Patient Information 2014 [place], [place]. Cholesterol Cholesterol is a white, waxy, fat-like protein needed by your body in small amounts. The liver makes all the cholesterol you need. It is carried from the liver by the blood through the blood vessels. Deposits (plaque) may build up on blood vessel walls. This makes the arteries narrower and stiffer. Plaque increases the risk for heart attack and stroke. You cannot feel your  cholesterol level even if it is very high. The only way to know is by a blood test to check your lipid (fats) levels. Once you know your cholesterol levels, you should keep a record of the test results. Work with your caregiver to to keep your levels in the desired range. WHAT THE RESULTS MEAN:  Total cholesterol is a rough measure of all the cholesterol in your blood.  LDL is the so-called bad cholesterol. This is the type that deposits cholesterol in the walls of the arteries. You want this level to be low.  HDL is the good cholesterol because it cleans the arteries and carries the LDL away. You want this level to be high.  Triglycerides are fat that the body can either burn for energy or store. High levels are closely linked to heart disease. DESIRED LEVELS:  Total cholesterol below 200.  LDL below 100 for people at risk, below 70 for very high risk.  HDL above 50 is good, above 60 is best.  Triglycerides below 150. HOW TO LOWER YOUR CHOLESTEROL:  Diet.  Choose fish or white meat chicken and [place], roasted or baked. Limit fatty cuts of red meat, fried foods, and processed meats, such as sausage and lunch meat.  Eat lots of fresh fruits and vegetables. Choose whole grains, beans, pasta, potatoes and  cereals.  Use only small amounts of olive, corn or canola oils. Avoid butter, mayonnaise, shortening or palm kernel oils. Avoid foods with trans-fats.  Use skim/nonfat milk and low-fat/nonfat yogurt and cheeses. Avoid whole milk, cream, ice cream, egg yolks and cheeses. Healthy desserts include angel food cake, ginger snaps, animal crackers, hard candy, popsicles, and low-fat/nonfat frozen yogurt. Avoid pastries, cakes, pies and cookies.  Exercise.  A regular program helps decrease LDL and raises HDL.  Helps with weight control.  Do things that increase your activity level like gardening, walking, or taking the stairs.  Medication.  May be prescribed by your caregiver to help lowering cholesterol and the risk for heart disease.  You may need medicine even if your levels are normal if you have several risk factors. HOME CARE INSTRUCTIONS   Follow your diet and exercise programs as suggested by your caregiver.  Take medications as directed.  Have blood work done when your caregiver feels it is necessary. MAKE SURE YOU:   Understand these instructions.  Will watch your condition.  Will get help right away if you are not doing well or get worse. Document Released: 01/13/2001 Document Revised: 07/13/2011 Document Reviewed: 07/06/2007 [facility] Patient Information 2014 [place], [place].

## 2012-11-21 NOTE — Progress Notes (Signed)
Patient ID: Spencer Reid, male   DOB: [birth year], 37 y.o.   MRN: [id number]   HPI: This is a 37 year old male who is here for two-week followup on his blood pressure and to obtain results on his blood work. He states that he has recently changed his eating habits and is eating low-fat foods and vegetables and fruits. He is also working out 1-1-1/2 hours, mainly cardio, at the gym.  No Known Allergies Past Medical History  Diagnosis Date  . Hypertension    Current Outpatient Prescriptions on File Prior to Visit  Medication Sig Dispense Refill  . amLODipine (NORVASC) 10 MG tablet Take 1 tablet (10 mg total) by mouth daily.  30 tablet  3  . aspirin 81 MG tablet Take 81 mg by mouth daily.      . metoprolol (LOPRESSOR) 50 MG tablet Take 1 tablet (50 mg total) by mouth 2 (two) times daily.  30 tablet  3  . sildenafil (VIAGRA) 100 MG tablet Take 0.5 tablets (50 mg total) by mouth daily as needed for erectile dysfunction.  15 tablet  0   No current facility-administered medications on file prior to visit.   Family History  Problem Relation Age of Onset  . Hypertension Mother   . Alcohol abuse Father   . Cerebral palsy Son    History   Social History  . Marital Status: Single    Spouse Name: N/A    Number of Children: N/A  . Years of Education: N/A   Occupational History  . Not on file.   Social History Main Topics  . Smoking status: Former Games developer  . Smokeless tobacco: Not on file  . Alcohol Use: Yes     Comment: occasional  . Drug Use: Yes    Special: Marijuana  . Sexually Active: Not on file   Other Topics Concern  . Not on file   Social History Narrative  . No narrative on file    Review of Systems ______ Constitutional: Negative for fever, chills, diaphoresis, activity change, appetite change and fatigue. ____ HENT: Negative for ear pain, nosebleeds, congestion, facial swelling, rhinorrhea, neck pain, neck stiffness and ear discharge.  ____ Eyes: Negative for pain,  discharge, redness, itching and visual disturbance. ____ Respiratory: Negative for cough, choking, chest tightness, shortness of breath, wheezing and stridor.  ____ Cardiovascular: Negative for chest pain, palpitations and leg swelling. ____ Gastrointestinal: Negative for Nausea/ Vomiting/ Diarrhea or Consitpation Genitourinary: Negative for dysuria, urgency, frequency, hematuria, flank pain, decreased urine volume, difficulty urinating and dyspareunia. ____ Musculoskeletal: Negative for back pain, joint swelling, arthralgias and gait problem. ________ Neurological: Negative for dizziness, tremors, seizures, syncope, facial asymmetry, speech difficulty, weakness, light-headedness, numbness and headaches. ____ Hematological: Negative for adenopathy. Does not bruise/bleed easily. ____ Psychiatric/Behavioral: Negative for hallucinations, behavioral problems, confusion, dysphoric mood, decreased concentration and agitation. ______   Objective:   Filed Vitals:   11/21/12 1533  BP: 143/95  Pulse: 60  Temp: 98.4 F (36.9 C)  Resp: 16    Physical Exam ______ Constitutional: Appears well-developed and well-nourished. No distress. ____ HENT: Normocephalic. External right and left ear normal. Oropharynx is clear and moist. ____ Eyes: Conjunctivae and EOM are normal. PERRLA, no scleral icterus. ____ Neck: Normal ROM. Neck supple. No JVD. No tracheal deviation. No thyromegaly. ____ CVS: RRR, S1/S2 +, no murmurs, no gallops, no carotid bruit.  Pulmonary: Effort and breath sounds normal, no stridor, rhonchi, wheezes, rales.  Abdominal: Soft. BS +,  no distension, tenderness, rebound or guarding.  ________ Musculoskeletal: Normal range of motion. No edema and no tenderness. ____ Lymphadenopathy: No lymphadenopathy noted, cervical, inguinal. Neuro: Alert. Normal reflexes, muscle tone coordination. No cranial nerve deficit. Skin: Skin is warm and dry. No rash noted. Not diaphoretic. No erythema. No  pallor. ____ Psychiatric: Normal mood and affect. Behavior, judgment, thought content normal. __  Lab Results  Component Value Date   WBC 7.4 09/18/2012   HGB 13.5 09/18/2012   HCT 41.6 09/18/2012   MCV 65.1* 09/18/2012   PLT 148* 09/18/2012   Lab Results  Component Value Date   CREATININE 0.95 09/18/2012   BUN 11 09/18/2012   NA 138 09/18/2012   K 3.7 09/18/2012   CL 99 09/18/2012   CO2 26 09/18/2012    Lab Results  Component Value Date   HGBA1C 6.1* 11/09/2012   Lipid Panel     Component Value Date/Time   CHOL 195 11/09/2012 1534   TRIG 121 11/09/2012 1534   HDL 34* 11/09/2012 1534   CHOLHDL 5.7 11/09/2012 1534   VLDL 24 11/09/2012 1534   LDLCALC 137* 11/09/2012 1534       Assessment and plan:   Patient Active Problem List   Diagnosis Date Noted  . Glucose intolerance (impaired glucose tolerance) 11/21/2012  . Morbid obesity 11/21/2012  . Essential hypertension, benign 11/21/2012  . Chest pain 09/18/2012  . Hypertensive urgency 09/18/2012    Blood work noted above reveals glucose intolerance, dyslipidemia (elevated LDL and low HDL) Discussed low-fat low-carb diet and the importance of exercise and eating healthy.  BP has improved with addition of metoprolol and Norvasc. No sexual side effects from metoprolol and therefore has not needed to fill the prescription for Viagra.  Has to continue current diet and exercise regimen and followup in 3 months for reassessment of hemoglobin A1c and lipid profile.

## 2012-11-21 NOTE — Progress Notes (Signed)
PT HERE RECHECK BP MEDICATION REFILL HCTZ DENIES CP MEDICATION MANAGEMENT

## 2012-11-22 ENCOUNTER — Telehealth: Payer: Self-pay | Admitting: *Deleted

## 2012-11-22 NOTE — Telephone Encounter (Signed)
11/22/12  Spoke with patient regarding pre-diabetes. Patient stated he  Discussed with doctor on yesterday 11/21/12 . [facility]

## 2012-11-29 ENCOUNTER — Ambulatory Visit: Payer: Self-pay | Attending: Family Medicine

## 2012-12-06 ENCOUNTER — Ambulatory Visit: Payer: 59

## 2013-01-12 ENCOUNTER — Telehealth: Payer: Self-pay | Admitting: Internal Medicine

## 2013-01-12 NOTE — Telephone Encounter (Signed)
Pt needs script refilled for metoprolol (LOPRESSOR) 50 MG tablet; pt is out of medication and his last appt was 11/21/12

## 2013-01-13 ENCOUNTER — Other Ambulatory Visit: Payer: Self-pay | Admitting: Internal Medicine

## 2013-01-13 ENCOUNTER — Other Ambulatory Visit: Payer: Self-pay | Admitting: Emergency Medicine

## 2013-01-13 MED ORDER — METOPROLOL TARTRATE 50 MG PO TABS: 50.0000 mg | ORAL_TABLET | Freq: Two times a day (BID) | ORAL | Status: DC

## 2013-01-13 MED ORDER — HYDROCHLOROTHIAZIDE 25 MG PO TABS: 25.0000 mg | ORAL_TABLET | Freq: Every day | ORAL | Status: DC

## 2013-01-13 NOTE — Telephone Encounter (Signed)
PT CAME IN PERSON AND SCRIPTS GIVEN

## 2013-01-17 ENCOUNTER — Ambulatory Visit: Payer: 59

## 2013-03-09 ENCOUNTER — Other Ambulatory Visit: Payer: Self-pay

## 2013-05-23 ENCOUNTER — Other Ambulatory Visit: Payer: Self-pay | Admitting: Emergency Medicine

## 2013-05-23 MED ORDER — METOPROLOL TARTRATE 50 MG PO TABS: 50.0000 mg | ORAL_TABLET | Freq: Two times a day (BID) | ORAL | Status: DC

## 2013-05-23 MED ORDER — HYDROCHLOROTHIAZIDE 25 MG PO TABS: 25.0000 mg | ORAL_TABLET | Freq: Every day | ORAL | Status: DC

## 2013-06-13 ENCOUNTER — Ambulatory Visit: Payer: 59

## 2013-11-01 ENCOUNTER — Telehealth: Payer: Self-pay | Admitting: Emergency Medicine

## 2013-11-01 ENCOUNTER — Telehealth: Payer: Self-pay | Admitting: Internal Medicine

## 2013-11-01 MED ORDER — METOPROLOL TARTRATE 50 MG PO TABS: 50.0000 mg | ORAL_TABLET | Freq: Two times a day (BID) | ORAL | Status: DC

## 2013-11-01 MED ORDER — AMLODIPINE BESYLATE 10 MG PO TABS: 10.0000 mg | ORAL_TABLET | Freq: Every day | ORAL | Status: DC

## 2013-11-01 MED ORDER — HYDROCHLOROTHIAZIDE 25 MG PO TABS: 25.0000 mg | ORAL_TABLET | Freq: Every day | ORAL | Status: DC

## 2013-11-01 NOTE — Telephone Encounter (Signed)
Pt informed we will refill medications for #30 day supply until seen by provider scheduled 11/23/13 Pt verbalized understanding. [facility] [address]

## 2013-11-01 NOTE — Telephone Encounter (Addendum)
Pt requesting med refill for hydrochlorothiazide (HYDRODIURIL) 25 MG tablet, metoprolol (LOPRESSOR) 50 MG tablet, amLODipine (NORVASC) 10 MG . Please f/u with pt he prefers to pick up the printed scripts.  Pt has scheduled f/u appt on 11/23/13.

## 2013-11-23 ENCOUNTER — Encounter: Payer: Self-pay | Admitting: Internal Medicine

## 2013-11-23 ENCOUNTER — Ambulatory Visit: Payer: Self-pay | Attending: Internal Medicine | Admitting: Internal Medicine

## 2013-11-23 VITALS — BP 151/99 | HR 98 | Temp 98.0°F | Resp 16

## 2013-11-23 DIAGNOSIS — N529 Male erectile dysfunction, unspecified: Secondary | ICD-10-CM | POA: Insufficient documentation

## 2013-11-23 DIAGNOSIS — Z6841 Body Mass Index (BMI) 40.0 and over, adult: Secondary | ICD-10-CM | POA: Insufficient documentation

## 2013-11-23 DIAGNOSIS — N528 Other male erectile dysfunction: Secondary | ICD-10-CM | POA: Insufficient documentation

## 2013-11-23 DIAGNOSIS — Z7982 Long term (current) use of aspirin: Secondary | ICD-10-CM | POA: Insufficient documentation

## 2013-11-23 DIAGNOSIS — F1210 Cannabis abuse, uncomplicated: Secondary | ICD-10-CM | POA: Insufficient documentation

## 2013-11-23 DIAGNOSIS — I10 Essential (primary) hypertension: Secondary | ICD-10-CM | POA: Insufficient documentation

## 2013-11-23 MED ORDER — ASPIRIN 81 MG PO TABS: 81.0000 mg | ORAL_TABLET | Freq: Every day | ORAL | Status: AC

## 2013-11-23 MED ORDER — AMLODIPINE BESYLATE 10 MG PO TABS: 10.0000 mg | ORAL_TABLET | Freq: Every day | ORAL | Status: DC

## 2013-11-23 MED ORDER — HYDROCHLOROTHIAZIDE 25 MG PO TABS: 25.0000 mg | ORAL_TABLET | Freq: Every day | ORAL | Status: AC

## 2013-11-23 MED ORDER — METOPROLOL TARTRATE 50 MG PO TABS: 50.0000 mg | ORAL_TABLET | Freq: Two times a day (BID) | ORAL | Status: DC

## 2013-11-23 MED ORDER — TADALAFIL 20 MG PO TABS: 10.0000 mg | ORAL_TABLET | ORAL | Status: DC | PRN

## 2013-11-23 NOTE — Progress Notes (Signed)
Pt is here following up on his HTN. Pt needs his medications refilled.

## 2013-11-23 NOTE — Progress Notes (Signed)
Patient ID: Spencer Reid, male   DOB: 29-Nov-1975, 38 y.o.   MRN: [id number]   Spencer Reid, is a 38 y.o. male  NFA:[id number]  ION:[id number]  DOB - August 12, 1975  Chief Complaint  Patient presents with  . Follow-up        Subjective:   Spencer Reid is a 38 y.o. male here today for a follow up visit. Patient has history of hypertension on amlodipine 10 mg tablet by mouth daily, hydrochlorothiazide 25 mg tablet by mouth daily, metoprolol 50 mg tablet by mouth twice a day, and aspirin 81 mg. He is here today for medication refills. He reports erectile dysfunction and that happens occasionally, he tried Viagra in the past but developed palpitation after the use, like to try a different medication. His blood pressure is controlled at home with her current regimen, he smokes marijuana, he denies the use of cigarettes smoke, she drinks alcohol occasionally. Patient has No headache, No chest pain, No abdominal pain - No Nausea, No new weakness tingling or numbness, No Cough - SOB.  Problem  Other Male Erectile Dysfunction    ALLERGIES: No Known Allergies  PAST MEDICAL HISTORY: Past Medical History  Diagnosis Date  . Hypertension     MEDICATIONS AT HOME: Prior to Admission medications   Medication Sig Start Date End Date Taking? Authorizing Provider  amLODipine (NORVASC) 10 MG tablet Take 1 tablet (10 mg total) by mouth daily. 11/23/13  Yes Jeanann Lewandowsky, MD  aspirin 81 MG tablet Take 1 tablet (81 mg total) by mouth daily. 11/23/13  Yes Jeanann Lewandowsky, MD  hydrochlorothiazide (HYDRODIURIL) 25 MG tablet Take 1 tablet (25 mg total) by mouth daily. 11/23/13  Yes Jeanann Lewandowsky, MD  metoprolol (LOPRESSOR) 50 MG tablet Take 1 tablet (50 mg total) by mouth 2 (two) times daily. 11/23/13  Yes Jeanann Lewandowsky, MD  sildenafil (VIAGRA) 100 MG tablet Take 0.5 tablets (50 mg total) by mouth daily as needed for erectile dysfunction. 11/09/12   Leroy Sea, MD  tadalafil (CIALIS) 20 MG  tablet Take 0.5-1 tablets (10-20 mg total) by mouth every other day as needed for erectile dysfunction. 11/23/13   Jeanann Lewandowsky, MD     Objective:   Filed Vitals:   11/23/13 1253 11/23/13 1254  BP: 149/100 151/99  Pulse: 98   Temp: 98 F (36.7 C)   TempSrc: Oral   Resp: 16   SpO2: 54%     Exam General appearance : Awake, alert, not in any distress. Speech Clear. Not toxic looking, morbidly obese HEENT: Atraumatic and Normocephalic, pupils equally reactive to light and accomodation Neck: supple, no JVD. No cervical lymphadenopathy.  Chest:Good air entry bilaterally, no added sounds  CVS: S1 S2 regular, no murmurs.  Abdomen: Bowel sounds present, Non tender and not distended with no gaurding, rigidity or rebound. Extremities: B/L Lower Ext shows no edema, both legs are warm to touch Neurology: Awake alert, and oriented X 3, CN II-XII intact, Non focal Skin:No Rash Wounds:N/A  Data Review Lab Results  Component Value Date   HGBA1C 6.1* 11/09/2012   HGBA1C 6.3* 09/18/2012     Assessment & Plan   1. Essential hypertension, benign Refill - aspirin 81 MG tablet; Take 1 tablet (81 mg total) by mouth daily.  Dispense: 90 tablet; Refill: 3 - hydrochlorothiazide (HYDRODIURIL) 25 MG tablet; Take 1 tablet (25 mg total) by mouth daily.  Dispense: 90 tablet; Refill: 3 - metoprolol (LOPRESSOR) 50 MG tablet; Take 1 tablet (50 mg total) by mouth 2 (two)  times daily.  Dispense: 180 tablet; Refill: 3 - amLODipine (NORVASC) 10 MG tablet; Take 1 tablet (10 mg total) by mouth daily.  Dispense: 90 tablet; Refill: 3  2. Morbid obesity Patient was counseled extensively about nutrition and exercise  3. Other male erectile dysfunction Prescribed - tadalafil (CIALIS) 20 MG tablet; Take 0.5-1 tablets (10-20 mg total) by mouth every other day as needed for erectile dysfunction.  Dispense: 110 tablet; Refill: 3  Patient was counseled extensively about nutrition and exercise Patient was  counseled to stop using marijuana  Return in about 3 months (around 02/23/2014), or if symptoms worsen or fail to improve, for Follow up HTN.  The patient was given clear instructions to go to ER or return to medical center if symptoms don't improve, worsen or new problems develop. The patient verbalized understanding. The patient was told to call to get lab results if they haven't heard anything in the next week.   This note has been created with Education officer, environmentalDragon speech recognition software and smart phrase technology. Any transcriptional errors are unintentional.    Jeanann LewandowskyJEGEDE, Darwin Guastella, MD, MHA, FACP, FAAP [facility] [place], [place] [phone]   11/23/2013, 1:06 PM

## 2013-11-23 NOTE — Patient Instructions (Signed)
Erectile Dysfunction Erectile dysfunction is the inability to get or sustain a good enough erection to have sexual intercourse. Erectile dysfunction may involve:  Inability to get an erection.  Lack of enough hardness to allow penetration.  Loss of the erection before sex is finished.  Premature ejaculation. CAUSES  Certain drugs, such as:  Pain relievers.  Antihistamines.  Antidepressants.  Blood pressure medicines.  Water pills (diuretics).  Ulcer medicines.  Muscle relaxants.  Illegal drugs.  Excessive drinking.  Psychological causes, such as:  Anxiety.  Depression.  Sadness.  Exhaustion.  Performance fear.  Stress.  Physical causes, such as:  Artery problems. This may include diabetes, smoking, liver disease, or atherosclerosis.  High blood pressure.  Hormonal problems, such as low testosterone.  Obesity.  Nerve problems. This may include back or pelvic injuries, diabetes mellitus, multiple sclerosis, or Parkinson disease. SYMPTOMS  Inability to get an erection.  Lack of enough hardness to allow penetration.  Loss of the erection before sex is finished.  Premature ejaculation.  Normal erections at some times, but with frequent unsatisfactory episodes.  Orgasms that are not satisfactory in sensation or frequency.  Low sexual satisfaction in either partner because of erection problems.  A curved penis occurring with erection. The curve may cause pain or may be too curved to allow for intercourse.  Never having nighttime erections. DIAGNOSIS Your caregiver can often diagnose this condition by:  Performing a physical exam to find other diseases or specific problems with the penis.  Asking you detailed questions about the problem.  Performing blood tests to check for diabetes mellitus or to measure hormone levels.  Performing urine tests to find other underlying health conditions.  Performing an ultrasound exam to check for  scarring.  Performing a test to check blood flow to the penis.  Doing a sleep study at home to measure nighttime erections. TREATMENT   You may be prescribed medicines by mouth.  You may be given medicine injections into the penis.  You may be prescribed a vacuum pump with a ring.  Penile implant surgery may be performed. You may receive:  An inflatable implant.  A semirigid implant.  Blood vessel surgery may be performed. HOME CARE INSTRUCTIONS  If you are prescribed oral medicine, you should take the medicine as prescribed. Do not increase the dosage without first discussing it with your physician.  If you are using self-injections, be careful to avoid any veins that are on the surface of the penis. Apply pressure to the injection site for 5 minutes.  If you are using a vacuum pump, make sure you have read the instructions before using it. Discuss any questions with your physician before taking the pump home. SEEK MEDICAL CARE IF:  You experience pain that is not responsive to the pain medicine you have been prescribed.  You experience nausea or vomiting. SEEK IMMEDIATE MEDICAL CARE IF:   When taking oral or injectable medications, you experience an erection that lasts longer than 4 hours. If your physician is unavailable, go to the nearest emergency room for evaluation. An erection that lasts much longer than 4 hours can result in permanent damage to your penis.  You have pain that is severe.  You develop redness, severe pain, or severe swelling of your penis.  You have redness spreading up into your groin or lower abdomen.  You are unable to pass your urine. Document Released: 04/17/2000 Document Revised: 12/21/2012 Document Reviewed: 09/22/2012 ExitCare Patient Information 2015 [employer]. This information is not   intended to replace advice given to you by your health care provider. Make sure you discuss any questions you have with your health care  provider. Hypertension Hypertension, commonly called high blood pressure, is when the force of blood pumping through your arteries is too strong. Your arteries are the blood vessels that carry blood from your heart throughout your body. A blood pressure reading consists of a higher number over a lower number, such as 110/72. The higher number (systolic) is the pressure inside your arteries when your heart pumps. The lower number (diastolic) is the pressure inside your arteries when your heart relaxes. Ideally you want your blood pressure below 120/80. Hypertension forces your heart to work harder to pump blood. Your arteries may become narrow or stiff. Having hypertension puts you at risk for heart disease, stroke, and other problems.  RISK FACTORS Some risk factors for high blood pressure are controllable. Others are not.  Risk factors you cannot control include:   Race. You may be at higher risk if you are African American.  Age. Risk increases with age.  Gender. Men are at higher risk than women before age 45 years. After age 65, women are at higher risk than men. Risk factors you can control include:  Not getting enough exercise or physical activity.  Being overweight.  Getting too much fat, sugar, calories, or salt in your diet.  Drinking too much alcohol. SIGNS AND SYMPTOMS Hypertension does not usually cause signs or symptoms. Extremely high blood pressure (hypertensive crisis) may cause headache, anxiety, shortness of breath, and nosebleed. DIAGNOSIS  To check if you have hypertension, your health care provider will measure your blood pressure while you are seated, with your arm held at the level of your heart. It should be measured at least twice using the same arm. Certain conditions can cause a difference in blood pressure between your right and left arms. A blood pressure reading that is higher than normal on one occasion does not mean that you need treatment. If one blood  pressure reading is high, ask your health care provider about having it checked again. TREATMENT  Treating high blood pressure includes making lifestyle changes and possibly taking medicine. Living a healthy lifestyle can help lower high blood pressure. You may need to change some of your habits. Lifestyle changes may include:  Following the DASH diet. This diet is high in fruits, vegetables, and whole grains. It is low in salt, red meat, and added sugars.  Getting at least 2 hours of brisk physical activity every week.  Losing weight if necessary.  Not smoking.  Limiting alcoholic beverages.  Learning ways to reduce stress. If lifestyle changes are not enough to get your blood pressure under control, your health care provider may prescribe medicine. You may need to take more than one. Work closely with your health care provider to understand the risks and benefits. HOME CARE INSTRUCTIONS  Have your blood pressure rechecked as directed by your health care provider.   Take medicines only as directed by your health care provider. Follow the directions carefully. Blood pressure medicines must be taken as prescribed. The medicine does not work as well when you skip doses. Skipping doses also puts you at risk for problems.   Do not smoke.   Monitor your blood pressure at home as directed by your health care provider. SEEK MEDICAL CARE IF:   You think you are having a reaction to medicines taken.  You have recurrent headaches or feel dizzy.    You have swelling in your ankles.  You have trouble with your vision. SEEK IMMEDIATE MEDICAL CARE IF:  You develop a severe headache or confusion.  You have unusual weakness, numbness, or feel faint.  You have severe chest or abdominal pain.  You vomit repeatedly.  You have trouble breathing. MAKE SURE YOU:   Understand these instructions.  Will watch your condition.  Will get help right away if you are not doing well or get  worse. Document Released: 04/20/2005 Document Revised: 09/04/2013 Document Reviewed: 02/10/2013 ExitCare Patient Information 2015 [employer]. This information is not intended to replace advice given to you by your health care provider. Make sure you discuss any questions you have with your health care provider. DASH Eating Plan DASH stands for "Dietary Approaches to Stop Hypertension." The DASH eating plan is a healthy eating plan that has been shown to reduce high blood pressure (hypertension). Additional health benefits may include reducing the risk of type 2 diabetes mellitus, heart disease, and stroke. The DASH eating plan may also help with weight loss. WHAT DO I NEED TO KNOW ABOUT THE DASH EATING PLAN? For the DASH eating plan, you will follow these general guidelines:  Choose foods with a percent daily value for sodium of less than 5% (as listed on the food label).  Use salt-free seasonings or herbs instead of table salt or sea salt.  Check with your health care provider or pharmacist before using salt substitutes.  Eat lower-sodium products, often labeled as "lower sodium" or "no salt added."  Eat fresh foods.  Eat more vegetables, fruits, and low-fat dairy products.  Choose whole grains. Look for the word "whole" as the first word in the ingredient list.  Choose fish and skinless chicken or turkey more often than red meat. Limit fish, poultry, and meat to 6 oz (170 g) each day.  Limit sweets, desserts, sugars, and sugary drinks.  Choose heart-healthy fats.  Limit cheese to 1 oz (28 g) per day.  Eat more home-cooked food and less restaurant, buffet, and fast food.  Limit fried foods.  Cook foods using methods other than frying.  Limit canned vegetables. If you do use them, rinse them well to decrease the sodium.  When eating at a restaurant, ask that your food be prepared with less salt, or no salt if possible. WHAT FOODS CAN I EAT? Seek help from a dietitian for  individual calorie needs. Grains Whole grain or whole wheat bread. Brown rice. Whole grain or whole wheat pasta. Quinoa, bulgur, and whole grain cereals. Low-sodium cereals. Corn or whole wheat flour tortillas. Whole grain cornbread. Whole grain crackers. Low-sodium crackers. Vegetables Fresh or frozen vegetables (raw, steamed, roasted, or grilled). Low-sodium or reduced-sodium tomato and vegetable juices. Low-sodium or reduced-sodium tomato sauce and paste. Low-sodium or reduced-sodium canned vegetables.  Fruits All fresh, canned (in natural juice), or frozen fruits. Meat and Other Protein Products Ground beef (85% or leaner), grass-fed beef, or beef trimmed of fat. Skinless chicken or turkey. Ground chicken or turkey. Pork trimmed of fat. All fish and seafood. Eggs. Dried beans, peas, or lentils. Unsalted nuts and seeds. Unsalted canned beans. Dairy Low-fat dairy products, such as skim or 1% milk, 2% or reduced-fat cheeses, low-fat ricotta or cottage cheese, or plain low-fat yogurt. Low-sodium or reduced-sodium cheeses. Fats and Oils Tub margarines without trans fats. Light or reduced-fat mayonnaise and salad dressings (reduced sodium). Avocado. Safflower, olive, or canola oils. Natural peanut or almond butter. Other Unsalted popcorn and pretzels. The items   listed above may not be a complete list of recommended foods or beverages. Contact your dietitian for more options. WHAT FOODS ARE NOT RECOMMENDED? Grains White bread. White pasta. White rice. Refined cornbread. Bagels and croissants. Crackers that contain trans fat. Vegetables Creamed or fried vegetables. Vegetables in a cheese sauce. Regular canned vegetables. Regular canned tomato sauce and paste. Regular tomato and vegetable juices. Fruits Dried fruits. Canned fruit in light or heavy syrup. Fruit juice. Meat and Other Protein Products Fatty cuts of meat. Ribs, chicken wings, bacon, sausage, bologna, salami, chitterlings, fatback, hot  dogs, bratwurst, and packaged luncheon meats. Salted nuts and seeds. Canned beans with salt. Dairy Whole or 2% milk, cream, half-and-half, and cream cheese. Whole-fat or sweetened yogurt. Full-fat cheeses or blue cheese. Nondairy creamers and whipped toppings. Processed cheese, cheese spreads, or cheese curds. Condiments Onion and garlic salt, seasoned salt, table salt, and sea salt. Canned and packaged gravies. Worcestershire sauce. Tartar sauce. Barbecue sauce. Teriyaki sauce. Soy sauce, including reduced sodium. Steak sauce. Fish sauce. Oyster sauce. Cocktail sauce. Horseradish. Ketchup and mustard. Meat flavorings and tenderizers. Bouillon cubes. Hot sauce. Tabasco sauce. Marinades. Taco seasonings. Relishes. Fats and Oils Butter, stick margarine, lard, shortening, ghee, and bacon fat. Coconut, palm kernel, or palm oils. Regular salad dressings. Other Pickles and olives. Salted popcorn and pretzels. The items listed above may not be a complete list of foods and beverages to avoid. Contact your dietitian for more information. WHERE CAN I FIND MORE INFORMATION? National Heart, Lung, and [employer] Institute: www.nhlbi.nih.gov/health/health-topics/topics/dash/ Document Released: 04/09/2011 Document Revised: 09/04/2013 Document Reviewed: 02/22/2013 ExitCare Patient Information 2015 [employer]. This information is not intended to replace advice given to you by your health care provider. Make sure you discuss any questions you have with your health care provider.  

## 2013-11-24 ENCOUNTER — Encounter: Payer: Self-pay | Admitting: Internal Medicine

## 2013-11-30 ENCOUNTER — Ambulatory Visit: Payer: 59 | Attending: Internal Medicine

## 2017-11-25 ENCOUNTER — Emergency Department: Payer: No Typology Code available for payment source

## 2017-11-25 ENCOUNTER — Other Ambulatory Visit: Payer: Self-pay

## 2017-11-25 ENCOUNTER — Emergency Department
Admission: EM | Admit: 2017-11-25 | Discharge: 2017-11-25 | Disposition: A | Discharge status: Home / Self Care | Payer: No Typology Code available for payment source | Attending: Emergency Medicine | Admitting: Emergency Medicine

## 2017-11-25 ENCOUNTER — Encounter: Payer: Self-pay | Admitting: Emergency Medicine

## 2017-11-25 DIAGNOSIS — Z79899 Other long term (current) drug therapy: Secondary | ICD-10-CM | POA: Diagnosis not present

## 2017-11-25 DIAGNOSIS — S8992XA Unspecified injury of left lower leg, initial encounter: Secondary | ICD-10-CM | POA: Diagnosis present

## 2017-11-25 DIAGNOSIS — Y9389 Activity, other specified: Secondary | ICD-10-CM | POA: Diagnosis not present

## 2017-11-25 DIAGNOSIS — Z87891 Personal history of nicotine dependence: Secondary | ICD-10-CM | POA: Diagnosis not present

## 2017-11-25 DIAGNOSIS — M791 Myalgia, unspecified site: Secondary | ICD-10-CM | POA: Insufficient documentation

## 2017-11-25 DIAGNOSIS — Y999 Unspecified external cause status: Secondary | ICD-10-CM | POA: Diagnosis not present

## 2017-11-25 DIAGNOSIS — S8012XA Contusion of left lower leg, initial encounter: Secondary | ICD-10-CM | POA: Insufficient documentation

## 2017-11-25 DIAGNOSIS — I10 Essential (primary) hypertension: Secondary | ICD-10-CM | POA: Diagnosis not present

## 2017-11-25 DIAGNOSIS — Y92415 Exit ramp or entrance ramp of street or highway as the place of occurrence of the external cause: Secondary | ICD-10-CM | POA: Diagnosis not present

## 2017-11-25 DIAGNOSIS — Z7982 Long term (current) use of aspirin: Secondary | ICD-10-CM | POA: Insufficient documentation

## 2017-11-25 DIAGNOSIS — R51 Headache: Secondary | ICD-10-CM | POA: Insufficient documentation

## 2017-11-25 DIAGNOSIS — M7918 Myalgia, other site: Secondary | ICD-10-CM

## 2017-11-25 DIAGNOSIS — R519 Headache, unspecified: Secondary | ICD-10-CM

## 2017-11-25 MED ORDER — OXYCODONE-ACETAMINOPHEN 5-325 MG PO TABS
ORAL_TABLET | ORAL | Status: AC
  Filled 2017-11-25: qty 2

## 2017-11-25 MED ORDER — OXYCODONE-ACETAMINOPHEN 5-325 MG PO TABS
2.0000 | ORAL_TABLET | Freq: Once | ORAL | Status: AC
  Administered 2017-11-25: 2 via ORAL
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
## 2017-11-25 NOTE — Discharge Instructions (Addendum)
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
## 2017-11-28 ENCOUNTER — Emergency Department (HOSPITAL_COMMUNITY)
Admission: EM | Admit: 2017-11-28 | Discharge: 2017-11-28 | Disposition: A | Discharge status: Home / Self Care | Payer: No Typology Code available for payment source | Attending: Emergency Medicine | Admitting: Emergency Medicine

## 2017-11-28 ENCOUNTER — Encounter (HOSPITAL_COMMUNITY): Payer: Self-pay | Admitting: Emergency Medicine

## 2017-11-28 ENCOUNTER — Emergency Department (HOSPITAL_COMMUNITY): Payer: No Typology Code available for payment source

## 2017-11-28 DIAGNOSIS — S199XXA Unspecified injury of neck, initial encounter: Secondary | ICD-10-CM | POA: Diagnosis present

## 2017-11-28 DIAGNOSIS — I10 Essential (primary) hypertension: Secondary | ICD-10-CM | POA: Insufficient documentation

## 2017-11-28 DIAGNOSIS — Y92411 Interstate highway as the place of occurrence of the external cause: Secondary | ICD-10-CM | POA: Insufficient documentation

## 2017-11-28 DIAGNOSIS — Z87891 Personal history of nicotine dependence: Secondary | ICD-10-CM | POA: Insufficient documentation

## 2017-11-28 DIAGNOSIS — Z7982 Long term (current) use of aspirin: Secondary | ICD-10-CM | POA: Insufficient documentation

## 2017-11-28 DIAGNOSIS — M25562 Pain in left knee: Secondary | ICD-10-CM | POA: Diagnosis not present

## 2017-11-28 DIAGNOSIS — Y998 Other external cause status: Secondary | ICD-10-CM | POA: Insufficient documentation

## 2017-11-28 DIAGNOSIS — R51 Headache: Secondary | ICD-10-CM | POA: Insufficient documentation

## 2017-11-28 DIAGNOSIS — Z79899 Other long term (current) drug therapy: Secondary | ICD-10-CM | POA: Insufficient documentation

## 2017-11-28 DIAGNOSIS — S143XXA Injury of brachial plexus, initial encounter: Secondary | ICD-10-CM

## 2017-11-28 DIAGNOSIS — M25561 Pain in right knee: Secondary | ICD-10-CM | POA: Insufficient documentation

## 2017-11-28 DIAGNOSIS — Y9389 Activity, other specified: Secondary | ICD-10-CM | POA: Diagnosis not present

## 2017-11-28 DIAGNOSIS — M436 Torticollis: Secondary | ICD-10-CM | POA: Insufficient documentation

## 2017-11-28 LAB — CBC WITH DIFFERENTIAL/PLATELET
Basophils Absolute: 0 10*3/uL (ref 0.0–0.1)
Basophils Relative: 0 %
EOS PCT: 1 %
Eosinophils Absolute: 0.1 10*3/uL (ref 0.0–0.7)
HCT: 47.6 % (ref 39.0–52.0)
HEMOGLOBIN: 14.1 g/dL (ref 13.0–17.0)
LYMPHS PCT: 48 %
Lymphs Abs: 2.5 10*3/uL (ref 0.7–4.0)
MCH: 20.6 pg — AB (ref 26.0–34.0)
MCHC: 29.6 g/dL — ABNORMAL LOW (ref 30.0–36.0)
MCV: 69.7 fL — ABNORMAL LOW (ref 78.0–100.0)
Monocytes Absolute: 0.4 10*3/uL (ref 0.1–1.0)
Monocytes Relative: 7 %
Neutro Abs: 2.3 10*3/uL (ref 1.7–7.7)
Neutrophils Relative %: 44 %
Platelets: 175 10*3/uL (ref 150–400)
RBC: 6.83 MIL/uL — AB (ref 4.22–5.81)
RDW: 16.8 % — ABNORMAL HIGH (ref 11.5–15.5)
WBC: 5.3 10*3/uL (ref 4.0–10.5)

## 2017-11-28 LAB — I-STAT CHEM 8, ED
BUN: 13 mg/dL (ref 6–20)
CHLORIDE: 104 mmol/L (ref 98–111)
Calcium, Ion: 1.15 mmol/L (ref 1.15–1.40)
Creatinine, Ser: 1 mg/dL (ref 0.61–1.24)
Glucose, Bld: 95 mg/dL (ref 70–99)
HCT: 46 % (ref 39.0–52.0)
Hemoglobin: 15.6 g/dL (ref 13.0–17.0)
POTASSIUM: 4.1 mmol/L (ref 3.5–5.1)
Sodium: 139 mmol/L (ref 135–145)
TCO2: 24 mmol/L (ref 22–32)

## 2017-11-28 MED ORDER — DICYCLOMINE HCL 10 MG PO CAPS
20.0000 mg | ORAL_CAPSULE | Freq: Once | ORAL | Status: AC
  Administered 2017-11-28: 20 mg via ORAL
  Filled 2017-11-28: qty 2

## 2017-11-28 MED ORDER — PROCHLORPERAZINE EDISYLATE 10 MG/2ML IJ SOLN
5.0000 mg | Freq: Once | INTRAMUSCULAR | Status: AC
  Administered 2017-11-28: 5 mg via INTRAVENOUS
  Filled 2017-11-28: qty 2

## 2017-11-28 MED ORDER — MELOXICAM 15 MG PO TABS: 15.0000 mg | ORAL_TABLET | Freq: Every day | ORAL | 0 refills | Status: DC

## 2017-11-28 MED ORDER — DIPHENHYDRAMINE HCL 50 MG/ML IJ SOLN
12.5000 mg | Freq: Once | INTRAMUSCULAR | Status: AC
  Administered 2017-11-28: 12.5 mg via INTRAVENOUS
  Filled 2017-11-28: qty 1

## 2017-11-28 MED ORDER — CYCLOBENZAPRINE HCL 10 MG PO TABS: 5.0000 mg | ORAL_TABLET | Freq: Two times a day (BID) | ORAL | 0 refills | Status: DC | PRN

## 2017-11-28 MED ORDER — IOPAMIDOL (ISOVUE-370) INJECTION 76%
INTRAVENOUS | Status: AC
  Administered 2017-11-28: 50 mL
  Filled 2017-11-28: qty 50

## 2017-11-28 MED ORDER — MORPHINE SULFATE (PF) 4 MG/ML IV SOLN
4.0000 mg | Freq: Once | INTRAVENOUS | Status: AC
  Administered 2017-11-28: 4 mg via INTRAVENOUS
  Filled 2017-11-28: qty 1

## 2017-11-28 MED ORDER — KETOROLAC TROMETHAMINE 30 MG/ML IJ SOLN
30.0000 mg | Freq: Once | INTRAMUSCULAR | Status: AC
  Administered 2017-11-28: 30 mg via INTRAVENOUS
  Filled 2017-11-28: qty 1

## 2017-11-28 MED ORDER — LABETALOL HCL 5 MG/ML IV SOLN
20.0000 mg | Freq: Once | INTRAVENOUS | Status: AC
  Administered 2017-11-28: 20 mg via INTRAVENOUS
  Filled 2017-11-28: qty 4
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
## 2017-11-28 NOTE — ED Notes (Signed)
Pt. To MRI via stretcher. 

## 2017-11-28 NOTE — Discharge Instructions (Addendum)
Get help right away if: You have: Numbness, tingling, or weakness in your arms or legs. Severe neck pain, especially tenderness in the middle of the back of your neck. Changes in bowel or bladder control. Increasing pain in any area of your body. Shortness of breath or light-headedness. Chest pain. Blood in your urine, stool, or vomit. Severe pain in your abdomen or your back. Severe or worsening headaches. Sudden vision loss or double vision. Your eye suddenly becomes red. Your pupil is an odd shape or size.

## 2018-04-08 ENCOUNTER — Other Ambulatory Visit: Payer: Self-pay

## 2018-04-08 ENCOUNTER — Emergency Department (HOSPITAL_BASED_OUTPATIENT_CLINIC_OR_DEPARTMENT_OTHER)
Admission: EM | Admit: 2018-04-08 | Discharge: 2018-04-08 | Disposition: A | Discharge status: Home / Self Care | Payer: Self-pay | Attending: Emergency Medicine | Admitting: Emergency Medicine

## 2018-04-08 ENCOUNTER — Encounter (HOSPITAL_BASED_OUTPATIENT_CLINIC_OR_DEPARTMENT_OTHER): Payer: Self-pay

## 2018-04-08 DIAGNOSIS — R350 Frequency of micturition: Secondary | ICD-10-CM | POA: Insufficient documentation

## 2018-04-08 DIAGNOSIS — I10 Essential (primary) hypertension: Secondary | ICD-10-CM | POA: Insufficient documentation

## 2018-04-08 DIAGNOSIS — Z79899 Other long term (current) drug therapy: Secondary | ICD-10-CM | POA: Insufficient documentation

## 2018-04-08 LAB — URINALYSIS, ROUTINE W REFLEX MICROSCOPIC
BILIRUBIN URINE: NEGATIVE
GLUCOSE, UA: NEGATIVE mg/dL
HGB URINE DIPSTICK: NEGATIVE
KETONES UR: NEGATIVE mg/dL
LEUKOCYTES UA: NEGATIVE
Nitrite: NEGATIVE
PH: 7 (ref 5.0–8.0)
Protein, ur: NEGATIVE mg/dL
Specific Gravity, Urine: 1.02 (ref 1.005–1.030)

## 2018-04-08 MED ORDER — AZITHROMYCIN 250 MG PO TABS
1000.0000 mg | ORAL_TABLET | Freq: Once | ORAL | Status: AC
  Administered 2018-04-08: 1000 mg via ORAL
  Filled 2018-04-08: qty 4

## 2018-04-08 MED ORDER — CEFTRIAXONE SODIUM 250 MG IJ SOLR
250.0000 mg | Freq: Once | INTRAMUSCULAR | Status: AC
  Administered 2018-04-08: 250 mg via INTRAMUSCULAR
  Filled 2018-04-08: qty 250

## 2018-04-08 NOTE — Discharge Instructions (Addendum)
We will call you if your cultures indicate you require further treatment or action.  Return to the ER if symptoms significantly worsen or change.

## 2018-04-08 NOTE — ED Provider Notes (Addendum)
[facility] EMERGENCY DEPARTMENT Provider Note   CSN: [id number] Arrival date & time: 04/08/18  1046     History   Chief Complaint Chief Complaint  Patient presents with  . Urinary Tract Infection    HPI Spencer Reid is a 42 y.o. male.  Patient is a 42 year old male presenting with complaints of urinary frequency and dysuria.  This is been ongoing for the past several days.  He denies any fevers or chills.  He denies any new sexual contacts or exposures.  He denies any urethral discharge or any genital lesions.  The history is provided by the patient.  Urinary Tract Infection   This is a new problem. The current episode started 2 days ago. The problem occurs every urination. The problem has been gradually worsening. The quality of the pain is described as burning. The pain is moderate. There has been no fever. Associated symptoms include frequency. Pertinent negatives include no chills, no sweats, no nausea, no vomiting, no discharge and no hesitancy.    Past Medical History:  Diagnosis Date  . Hypertension     Patient Active Problem List   Diagnosis Date Noted  . Other male erectile dysfunction 11/23/2013  . Glucose intolerance (impaired glucose tolerance) 11/21/2012  . Morbid obesity (HCC) 11/21/2012  . Essential hypertension, benign 11/21/2012  . Chest pain 09/18/2012  . Hypertensive urgency 09/18/2012    History reviewed. No pertinent surgical history.      Home Medications    Prior to Admission medications   Medication Sig Start Date End Date Taking? Authorizing Provider  amLODipine (NORVASC) 10 MG tablet Take 1 tablet (10 mg total) by mouth daily. Patient not taking: Reported on 11/28/2017 11/23/13   Quentin Angst, MD  aspirin 81 MG tablet Take 1 tablet (81 mg total) by mouth daily. 11/23/13   Quentin Angst, MD  cyclobenzaprine (FLEXERIL) 10 MG tablet Take 0.5-1 tablets (5-10 mg total) by mouth 2 (two) times daily as needed for muscle  spasms. 11/28/17   Arthor Captain, PA-C  hydrochlorothiazide (HYDRODIURIL) 25 MG tablet Take 1 tablet (25 mg total) by mouth daily. 11/23/13   Quentin Angst, MD  meloxicam (MOBIC) 15 MG tablet Take 1 tablet (15 mg total) by mouth daily. Take 1 daily with food. 11/28/17   Arthor Captain, PA-C  metoprolol (LOPRESSOR) 50 MG tablet Take 1 tablet (50 mg total) by mouth 2 (two) times daily. Patient not taking: Reported on 11/28/2017 11/23/13   Quentin Angst, MD  sildenafil (VIAGRA) 100 MG tablet Take 0.5 tablets (50 mg total) by mouth daily as needed for erectile dysfunction. Patient not taking: Reported on 11/28/2017 11/09/12   Leroy Sea, MD  tadalafil (CIALIS) 20 MG tablet Take 0.5-1 tablets (10-20 mg total) by mouth every other day as needed for erectile dysfunction. Patient not taking: Reported on 11/28/2017 11/23/13   Quentin Angst, MD    Family History Family History  Problem Relation Age of Onset  . Hypertension Mother   . Alcohol abuse Father   . Cerebral palsy Son     Social History Social History   Tobacco Use  . Smoking status: Former Games developer  . Smokeless tobacco: Former Engineer, water Use Topics  . Alcohol use: Yes    Comment: occasional  . Drug use: Yes    Types: Marijuana     Allergies   Patient has no known allergies.   Review of Systems Review of Systems  Constitutional: Negative for chills.  Gastrointestinal:  Negative for nausea and vomiting.  Genitourinary: Positive for frequency. Negative for hesitancy.  All other systems reviewed and are negative.    Physical Exam Updated Vital Signs BP (!) 207/143 (BP Location: Left Arm) Comment: did not take BP meds  Pulse 74   Temp 98.2 F (36.8 C) (Oral)   Resp 18   Ht 6\' 2"  (1.88 m)   Wt 131.5 kg   SpO2 100%   BMI 37.23 kg/m   Physical Exam Vitals signs and nursing note reviewed.  Constitutional:      General: He is not in acute distress.    Appearance: He is well-developed. He is  not diaphoretic.  HENT:     Head: Normocephalic and atraumatic.  Neck:     Musculoskeletal: Normal range of motion and neck supple.  Cardiovascular:     Rate and Rhythm: Normal rate and regular rhythm.     Heart sounds: No murmur. No friction rub.  Pulmonary:     Effort: Pulmonary effort is normal. No respiratory distress.     Breath sounds: Normal breath sounds. No wheezing or rales.  Abdominal:     General: Bowel sounds are normal. There is no distension.     Palpations: Abdomen is soft.     Tenderness: There is no abdominal tenderness.  Genitourinary:    Penis: Normal.      Scrotum/Testes: Normal.  Musculoskeletal: Normal range of motion.  Skin:    General: Skin is warm and dry.  Neurological:     Mental Status: He is alert and oriented to person, place, and time.     Coordination: Coordination normal.      ED Treatments / Results  Labs (all labs ordered are listed, but only abnormal results are displayed) Labs Reviewed  URINALYSIS, ROUTINE W REFLEX MICROSCOPIC  GC/CHLAMYDIA PROBE AMP ([place]) NOT AT [facility]    EKG None  Radiology No results found.  Procedures Procedures (including critical care time)  Medications Ordered in ED Medications  cefTRIAXone (ROCEPHIN) injection 250 mg (has no administration in time range)  azithromycin (ZITHROMAX) tablet 1,000 mg (has no administration in time range)     Initial Impression / Assessment and Plan / ED Course  I have reviewed the triage vital signs and the nursing notes.  Pertinent labs & imaging results that were available during my care of the patient were reviewed by me and considered in my medical decision making (see chart for details).  Patient presents here with complaints of urinary frequency.  He states that he has burning at the end of his urine stream.  He denies any fevers, chills, abdominal pain, or blood in his urine.  His work-up reveals a clear urinalysis.  This does not appear to be a UTI.  There  is also no glucose in his urine which I feel excludes hyperglycemia as a cause.  He does state that he has been drinking excessive amounts of iced tea recently and I believe this may be the cause.  I have advised him to cut back on this consumption and see what happens.  I also discussed the possibility of an STD.  He is married and in a monogamous relationship and doubts that this would be the case.  He did request that we add a GC and chlamydia test to his urine.  This was done.  The patient also requests that we treat presumptively for an STD because he is leaving town tomorrow and does not want to have to find a  pharmacy when he is out of town.  He was given Rocephin and Zithromax and will be discharged.  He will be notified if his cultures are positive and require further treatment.  Final Clinical Impressions(s) / ED Diagnoses   Final diagnoses:  None    ED Discharge Orders    None       Geoffery Lyons, MD 04/08/18 1239    Geoffery Lyons, MD 04/18/18 [phone]

## 2018-04-08 NOTE — ED Triage Notes (Signed)
Pt c/o frequent urination x4 days now having burning after urination is finished

## 2018-04-11 LAB — GC/CHLAMYDIA PROBE AMP (~~LOC~~) NOT AT ARMC
Chlamydia: NEGATIVE
NEISSERIA GONORRHEA: NEGATIVE

## 2021-09-14 ENCOUNTER — Encounter (HOSPITAL_BASED_OUTPATIENT_CLINIC_OR_DEPARTMENT_OTHER): Payer: Self-pay | Admitting: Emergency Medicine

## 2021-09-14 ENCOUNTER — Emergency Department (HOSPITAL_BASED_OUTPATIENT_CLINIC_OR_DEPARTMENT_OTHER)
Admission: EM | Admit: 2021-09-14 | Discharge: 2021-09-14 | Disposition: A | Discharge status: Home / Self Care | Payer: BC Managed Care – PPO | Attending: Emergency Medicine | Admitting: Emergency Medicine

## 2021-09-14 ENCOUNTER — Emergency Department (HOSPITAL_BASED_OUTPATIENT_CLINIC_OR_DEPARTMENT_OTHER): Payer: BC Managed Care – PPO

## 2021-09-14 ENCOUNTER — Other Ambulatory Visit: Payer: Self-pay

## 2021-09-14 DIAGNOSIS — I10 Essential (primary) hypertension: Secondary | ICD-10-CM | POA: Diagnosis not present

## 2021-09-14 DIAGNOSIS — Z7982 Long term (current) use of aspirin: Secondary | ICD-10-CM | POA: Diagnosis not present

## 2021-09-14 DIAGNOSIS — Z79899 Other long term (current) drug therapy: Secondary | ICD-10-CM | POA: Insufficient documentation

## 2021-09-14 DIAGNOSIS — M25571 Pain in right ankle and joints of right foot: Secondary | ICD-10-CM | POA: Insufficient documentation

## 2021-09-14 MED ORDER — OXYCODONE-ACETAMINOPHEN 5-325 MG PO TABS
1.0000 | ORAL_TABLET | Freq: Once | ORAL | Status: AC
Start: 2021-09-14 — End: 2021-09-14
  Administered 2021-09-14: 1 via ORAL
  Filled 2021-09-14: qty 1

## 2021-09-14 MED ORDER — NAPROXEN 250 MG PO TABS
500.0000 mg | ORAL_TABLET | Freq: Once | ORAL | Status: AC
  Administered 2021-09-14: 500 mg via ORAL
  Filled 2021-09-14: qty 2

## 2021-09-14 MED ORDER — NAPROXEN 500 MG PO TABS: 500.0000 mg | ORAL_TABLET | Freq: Two times a day (BID) | ORAL | 0 refills | Status: DC | PRN

## 2021-09-14 NOTE — Discharge Instructions (Signed)
If you develop fever, increasing pain, swelling, redness, inability to walk, contact ER for reassessment.  Otherwise I strongly recommend following up with the orthopedic doctor, call their office tomorrow to request appointment. ? ?Regarding your blood pressure being elevated today I strongly recommend following up with your primary care doctor to discuss ongoing management of your blood pressure. ?

## 2021-09-14 NOTE — ED Provider Notes (Signed)
?[facility] EMERGENCY DEPARTMENT ?Provider Note ? ? ?CSN: [id number] ?Arrival date & time: 09/14/21  2014 ? ?  ? ?History ? ?Chief Complaint  ?Patient presents with  ? Foot Pain  ? ? ?Spencer Reid is a 46 y.o. male.  Presents to ER for ankle pain, foot pain.  Patient reports that pain started out in the bottom of his foot on Wednesday and then seems to have progressed to include his right ankle now.  Has also noted a slight amount of swelling in the foot and ankle.  No redness, no fevers or chills.  Otherwise feels fine.  Has been able to walk albeit with some discomfort.  No obvious leaving or aggravating symptoms except symptoms worse with movements. ? ?HPI ? ?  ? ?Home Medications ?Prior to Admission medications   ?Medication Sig Start Date End Date Taking? Authorizing Provider  ?naproxen (NAPROSYN) 500 MG tablet Take 1 tablet (500 mg total) by mouth 2 (two) times daily as needed. 09/14/21  Yes Milagros Loll, MD  ?amLODipine (NORVASC) 10 MG tablet Take 1 tablet (10 mg total) by mouth daily. ?Patient not taking: Reported on 11/28/2017 11/23/13   Quentin Angst, MD  ?aspirin 81 MG tablet Take 1 tablet (81 mg total) by mouth daily. 11/23/13   Quentin Angst, MD  ?cyclobenzaprine (FLEXERIL) 10 MG tablet Take 0.5-1 tablets (5-10 mg total) by mouth 2 (two) times daily as needed for muscle spasms. 11/28/17   Arthor Captain, PA-C  ?hydrochlorothiazide (HYDRODIURIL) 25 MG tablet Take 1 tablet (25 mg total) by mouth daily. 11/23/13   Quentin Angst, MD  ?meloxicam (MOBIC) 15 MG tablet Take 1 tablet (15 mg total) by mouth daily. Take 1 daily with food. 11/28/17   Arthor Captain, PA-C  ?metoprolol (LOPRESSOR) 50 MG tablet Take 1 tablet (50 mg total) by mouth 2 (two) times daily. ?Patient not taking: Reported on 11/28/2017 11/23/13   Quentin Angst, MD  ?sildenafil (VIAGRA) 100 MG tablet Take 0.5 tablets (50 mg total) by mouth daily as needed for erectile dysfunction. ?Patient not taking:  Reported on 11/28/2017 11/09/12   Leroy Sea, MD  ?tadalafil (CIALIS) 20 MG tablet Take 0.5-1 tablets (10-20 mg total) by mouth every other day as needed for erectile dysfunction. ?Patient not taking: Reported on 11/28/2017 11/23/13   Quentin Angst, MD  ?   ? ?Allergies    ?Patient has no known allergies.   ? ?Review of Systems   ?Review of Systems  ?Constitutional:  Negative for chills and fever.  ?HENT:  Negative for ear pain and sore throat.   ?Eyes:  Negative for pain and visual disturbance.  ?Respiratory:  Negative for cough and shortness of breath.   ?Cardiovascular:  Negative for chest pain and palpitations.  ?Gastrointestinal:  Negative for abdominal pain and vomiting.  ?Genitourinary:  Negative for dysuria and hematuria.  ?Musculoskeletal:  Positive for arthralgias. Negative for back pain.  ?Skin:  Negative for color change and rash.  ?Neurological:  Negative for seizures and syncope.  ?All other systems reviewed and are negative. ? ?Physical Exam ?Updated Vital Signs ?BP (!) 188/131 (BP Location: Left Arm)   Pulse 90   Temp 99.2 ?F (37.3 ?C) (Oral)   Resp 18   Ht 6\' 2"  (1.88 m)   Wt 131.5 kg   SpO2 95%   BMI 37.23 kg/m?  ?Physical Exam ?Vitals and nursing note reviewed.  ?Constitutional:   ?   General: He is not in acute distress. ?  Appearance: He is well-developed.  ?HENT:  ?   Head: Normocephalic and atraumatic.  ?Eyes:  ?   Conjunctiva/sclera: Conjunctivae normal.  ?Cardiovascular:  ?   Rate and Rhythm: Normal rate and regular rhythm.  ?   Heart sounds: No murmur heard. ?Pulmonary:  ?   Effort: Pulmonary effort is normal. No respiratory distress.  ?   Breath sounds: Normal breath sounds.  ?Abdominal:  ?   Palpations: Abdomen is soft.  ?   Tenderness: There is no abdominal tenderness.  ?Musculoskeletal:  ?   Cervical back: Neck supple.  ?   Comments: RLE: Patient has mild swelling to the ankle and foot, there is normal joint ROM throughout, normal DP and PT pulses, some tenderness to  the ankle and midfoot  ?Skin: ?   General: Skin is warm and dry.  ?   Capillary Refill: Capillary refill takes less than 2 seconds.  ?Neurological:  ?   Mental Status: He is alert.  ?Psychiatric:     ?   Mood and Affect: Mood normal.  ? ? ?ED Results / Procedures / Treatments   ?Labs ?(all labs ordered are listed, but only abnormal results are displayed) ?Labs Reviewed - No data to display ? ?EKG ?None ? ?Radiology ?DG Ankle Complete Right ? ?Result Date: 09/14/2021 ?CLINICAL DATA:  Right foot and ankle pain for 4 days EXAM: RIGHT FOOT COMPLETE - 3+ VIEW; RIGHT ANKLE - COMPLETE 3+ VIEW COMPARISON:  None Available. FINDINGS: Right ankle: Frontal, oblique, and lateral views are obtained. No acute fracture, subluxation, or dislocation. There is mild medial soft tissue swelling. Joint spaces are relatively well preserved. Right foot: Frontal, oblique, lateral views are obtained. Hypertrophic changes are seen at the tarsometatarsal joints on lateral view consistent with mild osteoarthritis. No acute fracture, subluxation, or dislocation. Medial soft tissue swelling of the hindfoot. IMPRESSION: 1. Soft tissue swelling of the medial ankle and hindfoot. 2. No acute bony abnormalities. 3. Mild midfoot osteoarthritis. Electronically Signed   By: Sharlet Salina M.D.   On: 09/14/2021 22:14  ? ?DG Foot Complete Right ? ?Result Date: 09/14/2021 ?CLINICAL DATA:  Right foot and ankle pain for 4 days EXAM: RIGHT FOOT COMPLETE - 3+ VIEW; RIGHT ANKLE - COMPLETE 3+ VIEW COMPARISON:  None Available. FINDINGS: Right ankle: Frontal, oblique, and lateral views are obtained. No acute fracture, subluxation, or dislocation. There is mild medial soft tissue swelling. Joint spaces are relatively well preserved. Right foot: Frontal, oblique, lateral views are obtained. Hypertrophic changes are seen at the tarsometatarsal joints on lateral view consistent with mild osteoarthritis. No acute fracture, subluxation, or dislocation. Medial soft tissue  swelling of the hindfoot. IMPRESSION: 1. Soft tissue swelling of the medial ankle and hindfoot. 2. No acute bony abnormalities. 3. Mild midfoot osteoarthritis. Electronically Signed   By: Sharlet Salina M.D.   On: 09/14/2021 22:14   ? ?Procedures ?Procedures  ? ? ?Medications Ordered in ED ?Medications  ?naproxen (NAPROSYN) tablet 500 mg (has no administration in time range)  ?oxyCODONE-acetaminophen (PERCOCET/ROXICET) 5-325 MG per tablet 1 tablet (1 tablet Oral Given 09/14/21 2201)  ? ? ?ED Course/ Medical Decision Making/ A&P ?  ?                        ?Medical Decision Making ?Amount and/or Complexity of Data Reviewed ?Radiology: ordered. ? ?Risk ?Prescription drug management. ? ? ?46 year old male presents to ER due to concern for right foot pain that is spreading to his right  ankle.  He has mild swelling, some tenderness to the ankle and foot.  Check plain films, I independently reviewed.  No acute fracture or dislocation noted.  Radiologist did comment on some soft tissue swelling of medial ankle and hindfoot.  Given patient has normal ROM, no significant redness, no fever, doubt septic joint.  Suspect arthritis versus possibly gout.  Recommend trial of NSAIDs, follow-up with Ortho.  Reviewed return precautions, discharged. ? ?Incidentally, noted patient to be hypertensive.  No associated symptoms.  Has prior history of hypertension.  Advise follow-up with PCP. ? ? ? ? ?After the discussed management above, the patient was determined to be safe for discharge.  The patient was in agreement with this plan and all questions regarding their care were answered.  ED return precautions were discussed and the patient will return to the ED with any significant worsening of condition. ? ? ? ? ? ? ? ? ?Final Clinical Impression(s) / ED Diagnoses ?Final diagnoses:  ?Right ankle pain, unspecified chronicity  ? ? ?Rx / DC Orders ?ED Discharge Orders   ? ?      Ordered  ?  naproxen (NAPROSYN) 500 MG tablet  2 times daily PRN        ? 09/14/21 2236  ? ?  ?  ? ?  ? ? ?  ?Milagros Lollykstra, Broc Caspers S, MD ?09/14/21 2240 ? ?

## 2021-09-14 NOTE — ED Triage Notes (Signed)
Reports pain in right foot that started on wednesday on the bottom of the foot.  Now pain is radiating into the ankle. ?

## 2022-10-20 ENCOUNTER — Emergency Department (HOSPITAL_BASED_OUTPATIENT_CLINIC_OR_DEPARTMENT_OTHER)
Admission: EM | Admit: 2022-10-20 | Discharge: 2022-10-20 | Disposition: A | Discharge status: Home / Self Care | Payer: BC Managed Care – PPO | Attending: Emergency Medicine | Admitting: Emergency Medicine

## 2022-10-20 ENCOUNTER — Other Ambulatory Visit: Payer: Self-pay

## 2022-10-20 ENCOUNTER — Encounter (HOSPITAL_BASED_OUTPATIENT_CLINIC_OR_DEPARTMENT_OTHER): Payer: Self-pay

## 2022-10-20 DIAGNOSIS — Y9361 Activity, american tackle football: Secondary | ICD-10-CM | POA: Insufficient documentation

## 2022-10-20 DIAGNOSIS — Z87891 Personal history of nicotine dependence: Secondary | ICD-10-CM | POA: Insufficient documentation

## 2022-10-20 DIAGNOSIS — Z7982 Long term (current) use of aspirin: Secondary | ICD-10-CM | POA: Insufficient documentation

## 2022-10-20 DIAGNOSIS — W2101XA Struck by football, initial encounter: Secondary | ICD-10-CM | POA: Insufficient documentation

## 2022-10-20 DIAGNOSIS — S86112A Strain of other muscle(s) and tendon(s) of posterior muscle group at lower leg level, left leg, initial encounter: Secondary | ICD-10-CM | POA: Insufficient documentation

## 2022-10-20 DIAGNOSIS — I10 Essential (primary) hypertension: Secondary | ICD-10-CM | POA: Insufficient documentation

## 2022-10-20 DIAGNOSIS — Z79899 Other long term (current) drug therapy: Secondary | ICD-10-CM | POA: Insufficient documentation

## 2022-10-20 MED ORDER — NAPROXEN 375 MG PO TABS: ORAL_TABLET | ORAL | 0 refills | Status: DC

## 2022-10-20 MED ORDER — NAPROXEN 250 MG PO TABS
500.0000 mg | ORAL_TABLET | Freq: Once | ORAL | Status: AC
  Administered 2022-10-20: 500 mg via ORAL
  Filled 2022-10-20: qty 2

## 2022-10-20 NOTE — ED Provider Notes (Signed)
[facility] Provider Note: Spencer Dell, MD, FACEP  CSN: [id number] MRN: [id number] ARRIVAL: 10/20/22 at 0018 ROOM: [facility]12/[facility]   CHIEF COMPLAINT  Leg Injury   HISTORY OF PRESENT ILLNESS  10/20/22 12:35 AM Spencer Reid is a 47 y.o. male who injured his left calf at football practice yesterday.  He felt a pop and subsequently he is having pain in the left calf which he rates as a 10 out of 10.  The pain is primarily in the medial aspect of the calf.  It is worse with flexion or extension of the left ankle.  He has no difficulty moving his left knee.    Past Medical History:  Diagnosis Date   Hypertension     History reviewed. No pertinent surgical history.  Family History  Problem Relation Age of Onset   Hypertension Mother    Alcohol abuse Father    Cerebral palsy Son     Social History   Tobacco Use   Smoking status: Former   Smokeless tobacco: Former  Substance Use Topics   Alcohol use: Yes    Comment: occasional   Drug use: Yes    Types: Marijuana    Prior to Admission medications   Medication Sig Start Date End Date Taking? Authorizing Provider  naproxen (NAPROSYN) 375 MG tablet Take 1 tablet twice daily as needed for leg pain. 10/20/22  Yes Jerzy Crotteau, MD  aspirin 81 MG tablet Take 1 tablet (81 mg total) by mouth daily. 11/23/13   Quentin Angst, MD  hydrochlorothiazide (HYDRODIURIL) 25 MG tablet Take 1 tablet (25 mg total) by mouth daily. 11/23/13   Quentin Angst, MD    Allergies Bee venom   REVIEW OF SYSTEMS  Negative except as noted here or in the History of Present Illness.   PHYSICAL EXAMINATION  Initial Vital Signs Blood pressure (!) 172/125, pulse 94, temperature 98 F (36.7 C), resp. rate 18, height 6\' 2"  (1.88 m), weight 134.3 kg, SpO2 100 %.  Examination General: Well-developed, well-nourished male in no acute distress; appearance consistent with age of record HENT: normocephalic; atraumatic Eyes: Normal  appearance Neck: supple Heart: regular rate and rhythm Lungs: clear to auscultation bilaterally Abdomen: soft; nondistended; nontender; bowel sounds present Extremities: No deformity; pulses normal; tenderness of medial left calf with pain on attempted movement of the left ankle Neurologic: Awake, alert and oriented; motor function intact in all extremities and symmetric; no facial droop Skin: Warm and dry Psychiatric: Normal mood and affect   RESULTS  Summary of this visit's results, reviewed and interpreted by myself:   EKG Interpretation  Date/Time:    Ventricular Rate:    PR Interval:    QRS Duration:   QT Interval:    QTC Calculation:   R Axis:     Text Interpretation:         Laboratory Studies: No results found for this or any previous visit (from the past 24 hour(s)). Imaging Studies: No results found.  ED COURSE and MDM  Nursing notes, initial and subsequent vitals signs, including pulse oximetry, reviewed and interpreted by myself.  Vitals:   10/20/22 0022 10/20/22 0023  BP:  (!) 172/125  Pulse:  94  Resp:  18  Temp:  98 F (36.7 C)  SpO2:  100%  Weight: 134.3 kg   Height: 6\' 2"  (1.88 m)    Medications  naproxen (NAPROSYN) tablet 500 mg (500 mg Oral Given 10/20/22 0048)    The location of the patient's  pain is consistent with a tear, likely partial, of the medial gastrocnemius muscle.  We will place in a short leg splint and provide crutches.  We will have him follow-up with orthopedics if symptoms persist.  PROCEDURES  Procedures   ED DIAGNOSES     ICD-10-CM   1. Sprain, gastrocnemius, left, initial encounter  N82.956O          Paula Libra, MD 10/20/22 [phone]

## 2022-10-20 NOTE — ED Triage Notes (Signed)
Pt arrives with c/o left lower leg pain in his calf. Per pt, he thinks he tore his calf muscle while at football practice.

## 2022-12-08 ENCOUNTER — Emergency Department (HOSPITAL_BASED_OUTPATIENT_CLINIC_OR_DEPARTMENT_OTHER)
Admission: EM | Admit: 2022-12-08 | Discharge: 2022-12-08 | Disposition: A | Discharge status: Home / Self Care | Payer: Self-pay | Attending: Emergency Medicine | Admitting: Emergency Medicine

## 2022-12-08 ENCOUNTER — Other Ambulatory Visit: Payer: Self-pay

## 2022-12-08 ENCOUNTER — Encounter (HOSPITAL_BASED_OUTPATIENT_CLINIC_OR_DEPARTMENT_OTHER): Payer: Self-pay | Admitting: Emergency Medicine

## 2022-12-08 DIAGNOSIS — R1310 Dysphagia, unspecified: Secondary | ICD-10-CM | POA: Insufficient documentation

## 2022-12-08 DIAGNOSIS — Z7982 Long term (current) use of aspirin: Secondary | ICD-10-CM | POA: Insufficient documentation

## 2022-12-08 DIAGNOSIS — J029 Acute pharyngitis, unspecified: Secondary | ICD-10-CM | POA: Insufficient documentation

## 2022-12-08 DIAGNOSIS — L539 Erythematous condition, unspecified: Secondary | ICD-10-CM | POA: Insufficient documentation

## 2022-12-08 DIAGNOSIS — Z87891 Personal history of nicotine dependence: Secondary | ICD-10-CM | POA: Insufficient documentation

## 2022-12-08 DIAGNOSIS — Z79899 Other long term (current) drug therapy: Secondary | ICD-10-CM | POA: Insufficient documentation

## 2022-12-08 DIAGNOSIS — I10 Essential (primary) hypertension: Secondary | ICD-10-CM | POA: Insufficient documentation

## 2022-12-08 LAB — GROUP A STREP BY PCR: Group A Strep by PCR: NOT DETECTED

## 2022-12-08 MED ORDER — DEXAMETHASONE SODIUM PHOSPHATE 10 MG/ML IJ SOLN
10.0000 mg | Freq: Once | INTRAMUSCULAR | Status: AC
Start: 2022-12-08 — End: 2022-12-08
  Administered 2022-12-08: 10 mg
  Filled 2022-12-08: qty 1

## 2022-12-08 MED ORDER — ACETAMINOPHEN 325 MG PO TABS: 650.0000 mg | ORAL_TABLET | Freq: Four times a day (QID) | ORAL | 0 refills | Status: AC | PRN

## 2022-12-08 MED ORDER — MENTHOL 3 MG MT LOZG: 1.0000 | LOZENGE | OROMUCOSAL | 0 refills | Status: DC | PRN

## 2022-12-08 MED ORDER — KETOROLAC TROMETHAMINE 60 MG/2ML IM SOLN
30.0000 mg | Freq: Once | INTRAMUSCULAR | Status: AC
  Administered 2022-12-08: 30 mg via INTRAMUSCULAR
  Filled 2022-12-08: qty 2

## 2022-12-08 MED ORDER — LIDOCAINE VISCOUS HCL 2 % MT SOLN
15.0000 mL | Freq: Once | OROMUCOSAL | Status: AC
  Administered 2022-12-08: 15 mL via OROMUCOSAL
  Filled 2022-12-08: qty 15

## 2022-12-08 MED ORDER — PENICILLIN G BENZATHINE 1200000 UNIT/2ML IM SUSY
1.2000 10*6.[IU] | PREFILLED_SYRINGE | Freq: Once | INTRAMUSCULAR | Status: AC
  Administered 2022-12-08: 1.2 10*6.[IU] via INTRAMUSCULAR
  Filled 2022-12-08: qty 2

## 2022-12-08 MED ORDER — AMOXICILLIN-POT CLAVULANATE 875-125 MG PO TABS: 1.0000 | ORAL_TABLET | Freq: Two times a day (BID) | ORAL | 0 refills | Status: DC

## 2022-12-08 MED ORDER — LIDOCAINE VISCOUS HCL 2 % MT SOLN: 15.0000 mL | OROMUCOSAL | 0 refills | Status: DC | PRN

## 2022-12-08 NOTE — ED Provider Notes (Signed)
[facility] Provider Note  CSN: [id number] Arrival date & time: 12/08/22 2151  Chief Complaint(s) Sore Throat  HPI Spencer Reid is a 47 y.o. male with past medical history as below, significant for hypertension who presents to the ED with complaint of sore throat. Pt reports onset of symptoms over last 48 hours. Child in family with URI type symptoms. Pt reports dysphagia, no dyspnea. No fever or chills, no n/v. Reduced PO intake 2/2 sore throat. No voice changes. No eye pain or headache. No cp or dib, no fevers. Difficulty taking medications 2/2 sore throat   Past Medical History Past Medical History:  Diagnosis Date   Hypertension    Patient Active Problem List   Diagnosis Date Noted   Other male erectile dysfunction 11/23/2013   Glucose intolerance (impaired glucose tolerance) 11/21/2012   Morbid obesity (HCC) 11/21/2012   Essential hypertension, benign 11/21/2012   Chest pain 09/18/2012   Hypertensive urgency 09/18/2012   Home Medication(s) Prior to Admission medications   Medication Sig Start Date End Date Taking? Authorizing Provider  acetaminophen (TYLENOL) 325 MG tablet Take 2 tablets (650 mg total) by mouth every 6 (six) hours as needed. 12/08/22  Yes Tanda Rockers A, DO  amoxicillin-clavulanate (AUGMENTIN) 875-125 MG tablet Take 1 tablet by mouth every 12 (twelve) hours. 12/08/22  Yes Tanda Rockers A, DO  lidocaine (XYLOCAINE) 2 % solution Use as directed 15 mLs in the mouth or throat as needed for mouth pain. Gargle and spit 12/08/22  Yes Tanda Rockers A, DO  menthol-cetylpyridinium (CEPACOL) 3 MG lozenge Take 1 lozenge (3 mg total) by mouth as needed for sore throat. 12/08/22  Yes Tanda Rockers A, DO  aspirin 81 MG tablet Take 1 tablet (81 mg total) by mouth daily. 11/23/13   Quentin Angst, MD  hydrochlorothiazide (HYDRODIURIL) 25 MG tablet Take 1 tablet (25 mg total) by mouth daily. 11/23/13   Quentin Angst, MD  naproxen  (NAPROSYN) 375 MG tablet Take 1 tablet twice daily as needed for leg pain. 10/20/22   Molpus, Jonny Ruiz, MD                                                                                                                                    Past Surgical History History reviewed. No pertinent surgical history. Family History Family History  Problem Relation Age of Onset   Hypertension Mother    Alcohol abuse Father    Cerebral palsy Son     Social History Social History   Tobacco Use   Smoking status: Former   Smokeless tobacco: Former  Substance Use Topics   Alcohol use: Yes    Comment: occasional   Drug use: Yes    Types: Marijuana   Allergies Bee venom  Review of Systems Review of Systems  Constitutional:  Negative for chills and fever.  HENT:  Positive for congestion, sore throat and  trouble swallowing. Negative for dental problem, ear discharge, facial swelling and voice change.   Respiratory:  Negative for chest tightness and shortness of breath.   Cardiovascular:  Negative for chest pain and palpitations.  Gastrointestinal:  Negative for abdominal pain, nausea and vomiting.  Genitourinary:  Negative for dysuria.  Musculoskeletal:  Negative for arthralgias and back pain.  Skin:  Negative for rash.  All other systems reviewed and are negative.   Physical Exam Vital Signs  I have reviewed the triage vital signs BP (!) 197/135 (BP Location: Left Arm)   Pulse 79   Temp 98 F (36.7 C)   Resp 20   Ht 6\' 2"  (1.88 m)   Wt 132.5 kg   SpO2 97%   BMI 37.49 kg/m  Physical Exam Vitals and nursing note reviewed.  Constitutional:      General: He is not in acute distress.    Appearance: Normal appearance. He is well-developed. He is not ill-appearing.  HENT:     Head: Normocephalic and atraumatic. No raccoon eyes, Battle's sign, right periorbital erythema or left periorbital erythema.     Jaw: There is normal jaw occlusion. No trismus.     Comments: No drooling trismus  stridor    Right Ear: External ear normal.     Left Ear: External ear normal.     Nose: Nose normal.     Mouth/Throat:     Mouth: Mucous membranes are moist.     Palate: No mass.     Pharynx: Uvula midline. Posterior oropharyngeal erythema present. No oropharyngeal exudate or uvula swelling.  Eyes:     General: Vision grossly intact. Gaze aligned appropriately. No scleral icterus.       Right eye: No discharge.        Left eye: No discharge.  Cardiovascular:     Rate and Rhythm: Normal rate.  Pulmonary:     Effort: Pulmonary effort is normal. No tachypnea, accessory muscle usage or respiratory distress.     Breath sounds: No stridor.  Abdominal:     General: Abdomen is flat. There is no distension.     Palpations: Abdomen is soft.     Tenderness: There is no guarding.  Musculoskeletal:        General: No deformity.     Cervical back: No rigidity.  Skin:    General: Skin is warm and dry.     Coloration: Skin is not cyanotic, jaundiced or pale.  Neurological:     Mental Status: He is alert and oriented to person, place, and time.     GCS: GCS eye subscore is 4. GCS verbal subscore is 5. GCS motor subscore is 6.  Psychiatric:        Speech: Speech normal.        Behavior: Behavior normal. Behavior is cooperative.     ED Results and Treatments Labs (all labs ordered are listed, but only abnormal results are displayed) Labs Reviewed  GROUP A STREP BY PCR  RESP PANEL BY RT-PCR (RSV, FLU A&B, COVID)  RVPGX2  Radiology No results found.  Pertinent labs & imaging results that were available during my care of the patient were reviewed by me and considered in my medical decision making (see MDM for details).  Medications Ordered in ED Medications  penicillin g benzathine (BICILLIN LA) 1200000 UNIT/2ML injection 1.2 Million Units (has no administration in time  range)  lidocaine (XYLOCAINE) 2 % viscous mouth solution 15 mL (15 mLs Mouth/Throat Given 12/08/22 2306)  dexamethasone (DECADRON) injection 10 mg (10 mg Other Given 12/08/22 2305)  ketorolac (TORADOL) injection 30 mg (30 mg Intramuscular Given 12/08/22 2302)                                                                                                                                     Procedures Procedures  (including critical care time)  Medical Decision Making / ED Course    Medical Decision Making:    Stevenson Mckaskle is a 47 y.o. male hx htn here w/ sore throat. The complaint involves an extensive differential diagnosis and also carries with it a high risk of complications and morbidity.  Serious etiology was considered. Ddx includes but is not limited to: pharyngitis, viral syndrome, covid, flu, PTA less likely, epiglottitis less likely  Complete initial physical exam performed, notably the patient  was NAD< HDS, no drooling stridor trismus .    Reviewed and confirmed nursing documentation for past medical history, family history, social history.  Vital signs reviewed.    Clinical Course as of 12/08/22 2309  Tue Dec 08, 2022  2303 Pt refused covid test  [SG]    Clinical Course User Index [SG] Sloan Leiter, DO    Narrative: 47 yo male Here w/ sore throat x2 days Strep neg Exam stable Tolerating secretions No dyspnea Not septic Refused viral swab Requesting bicillin shot and abx for home Give decadron, viscous lido, toradol here  F/u pcp Rehydration encouraged  The patient improved significantly and was discharged in stable condition. Detailed discussions were had with the patient regarding current findings, and need for close f/u with PCP or on call doctor. The patient has been instructed to return immediately if the symptoms worsen in any way for re-evaluation. Patient verbalized understanding and is in agreement with current care plan. All questions answered prior to  discharge.                  Additional history obtained: -Additional history obtained from na -External records from outside source obtained and reviewed including: Chart review including previous notes, labs, imaging, consultation notes including  prior Ed visits   Lab Tests: -I ordered, reviewed, and interpreted labs.   The pertinent results include:   Labs Reviewed  GROUP A STREP BY PCR  RESP PANEL BY RT-PCR (RSV, FLU A&B, COVID)  RVPGX2    Notable for strep -tive, he refused RVP  EKG   EKG Interpretation Date/Time:    Ventricular Rate:  PR Interval:    QRS Duration:    QT Interval:    QTC Calculation:   R Axis:      Text Interpretation:           Imaging Studies ordered: na   Medicines ordered and prescription drug management: Meds ordered this encounter  Medications   lidocaine (XYLOCAINE) 2 % viscous mouth solution 15 mL   dexamethasone (DECADRON) injection 10 mg   ketorolac (TORADOL) injection 30 mg   menthol-cetylpyridinium (CEPACOL) 3 MG lozenge    Sig: Take 1 lozenge (3 mg total) by mouth as needed for sore throat.    Dispense:  45 tablet    Refill:  0   lidocaine (XYLOCAINE) 2 % solution    Sig: Use as directed 15 mLs in the mouth or throat as needed for mouth pain. Gargle and spit    Dispense:  100 mL    Refill:  0   acetaminophen (TYLENOL) 325 MG tablet    Sig: Take 2 tablets (650 mg total) by mouth every 6 (six) hours as needed.    Dispense:  36 tablet    Refill:  0   amoxicillin-clavulanate (AUGMENTIN) 875-125 MG tablet    Sig: Take 1 tablet by mouth every 12 (twelve) hours.    Dispense:  14 tablet    Refill:  0   penicillin g benzathine (BICILLIN LA) 1200000 UNIT/2ML injection 1.2 Million Units    Order Specific Question:   Antibiotic Indication:    Answer:   Pharyngitis    -I have reviewed the patients home medicines and have made adjustments as needed   Consultations Obtained: na   Cardiac  Monitoring: na  Social Determinants of Health:  Diagnosis or treatment significantly limited by social determinants of health: former smoker, obese   Reevaluation: After the interventions noted above, I reevaluated the patient and found that they have improved  Co morbidities that complicate the patient evaluation  Past Medical History:  Diagnosis Date   Hypertension       Dispostion: Disposition decision including need for hospitalization was considered, and patient discharged from emergency department.    Final Clinical Impression(s) / ED Diagnoses Final diagnoses:  Pharyngitis, unspecified etiology        Sloan Leiter, DO 12/08/22 2309

## 2022-12-08 NOTE — Discharge Instructions (Addendum)
Follow up on mychart in regards to your COVID FLU and RSV swab results  It was a pleasure caring for you today in the emergency department.  Drink warm beverages with some honey to help with throat pain  Return if you are unable to swallow, trouble breathing, bad fever, vomiting, or any worsening or worrisome symptoms

## 2022-12-08 NOTE — ED Notes (Signed)
Patient declined respiratory panel swab. MD aware.

## 2022-12-08 NOTE — ED Triage Notes (Signed)
Pt POV c/o sore throat x32 hours. Denies cough/fever/known sick contact.

## 2023-11-15 ENCOUNTER — Observation Stay (HOSPITAL_BASED_OUTPATIENT_CLINIC_OR_DEPARTMENT_OTHER)
Admission: EM | Admit: 2023-11-15 | Discharge: 2023-11-16 | Disposition: A | Discharge status: Home / Self Care | Payer: MEDICAID | Attending: Emergency Medicine | Admitting: Emergency Medicine

## 2023-11-15 ENCOUNTER — Emergency Department (HOSPITAL_BASED_OUTPATIENT_CLINIC_OR_DEPARTMENT_OTHER): Payer: Self-pay

## 2023-11-15 ENCOUNTER — Other Ambulatory Visit: Payer: Self-pay

## 2023-11-15 ENCOUNTER — Encounter (HOSPITAL_BASED_OUTPATIENT_CLINIC_OR_DEPARTMENT_OTHER): Payer: Self-pay | Admitting: *Deleted

## 2023-11-15 DIAGNOSIS — I10 Essential (primary) hypertension: Secondary | ICD-10-CM | POA: Insufficient documentation

## 2023-11-15 DIAGNOSIS — E871 Hypo-osmolality and hyponatremia: Secondary | ICD-10-CM | POA: Insufficient documentation

## 2023-11-15 DIAGNOSIS — Z7982 Long term (current) use of aspirin: Secondary | ICD-10-CM | POA: Diagnosis not present

## 2023-11-15 DIAGNOSIS — A419 Sepsis, unspecified organism: Principal | ICD-10-CM | POA: Diagnosis present

## 2023-11-15 DIAGNOSIS — Z811 Family history of alcohol abuse and dependence: Secondary | ICD-10-CM

## 2023-11-15 DIAGNOSIS — L03317 Cellulitis of buttock: Secondary | ICD-10-CM | POA: Diagnosis not present

## 2023-11-15 DIAGNOSIS — Z8249 Family history of ischemic heart disease and other diseases of the circulatory system: Secondary | ICD-10-CM | POA: Diagnosis not present

## 2023-11-15 DIAGNOSIS — Z87891 Personal history of nicotine dependence: Secondary | ICD-10-CM | POA: Diagnosis not present

## 2023-11-15 DIAGNOSIS — Z6837 Body mass index (BMI) 37.0-37.9, adult: Secondary | ICD-10-CM | POA: Diagnosis not present

## 2023-11-15 DIAGNOSIS — L0232 Furuncle of buttock: Secondary | ICD-10-CM | POA: Diagnosis present

## 2023-11-15 DIAGNOSIS — E669 Obesity, unspecified: Secondary | ICD-10-CM | POA: Diagnosis not present

## 2023-11-15 DIAGNOSIS — Z79899 Other long term (current) drug therapy: Secondary | ICD-10-CM | POA: Diagnosis not present

## 2023-11-15 DIAGNOSIS — L0390 Cellulitis, unspecified: Principal | ICD-10-CM | POA: Diagnosis present

## 2023-11-15 DIAGNOSIS — E878 Other disorders of electrolyte and fluid balance, not elsewhere classified: Secondary | ICD-10-CM | POA: Insufficient documentation

## 2023-11-15 DIAGNOSIS — M7918 Myalgia, other site: Secondary | ICD-10-CM | POA: Diagnosis present

## 2023-11-15 DIAGNOSIS — Z91030 Bee allergy status: Secondary | ICD-10-CM | POA: Diagnosis not present

## 2023-11-15 DIAGNOSIS — D72829 Elevated white blood cell count, unspecified: Secondary | ICD-10-CM | POA: Insufficient documentation

## 2023-11-15 DIAGNOSIS — R509 Fever, unspecified: Secondary | ICD-10-CM | POA: Diagnosis present

## 2023-11-15 LAB — CBC WITH DIFFERENTIAL/PLATELET
Abs Immature Granulocytes: 0.03 K/uL (ref 0.00–0.07)
Basophils Absolute: 0 K/uL (ref 0.0–0.1)
Basophils Relative: 0 %
Eosinophils Absolute: 0 K/uL (ref 0.0–0.5)
Eosinophils Relative: 0 %
HCT: 43.8 % (ref 39.0–52.0)
Hemoglobin: 14 g/dL (ref 13.0–17.0)
Immature Granulocytes: 0 %
Lymphocytes Relative: 23 %
Lymphs Abs: 3.1 K/uL (ref 0.7–4.0)
MCH: 20.9 pg — ABNORMAL LOW (ref 26.0–34.0)
MCHC: 32 g/dL (ref 30.0–36.0)
MCV: 65.4 fL — ABNORMAL LOW (ref 80.0–100.0)
Monocytes Absolute: 1.2 K/uL — ABNORMAL HIGH (ref 0.1–1.0)
Monocytes Relative: 9 %
Neutro Abs: 9.2 K/uL — ABNORMAL HIGH (ref 1.7–7.7)
Neutrophils Relative %: 68 %
Platelets: 162 K/uL (ref 150–400)
RBC: 6.7 MIL/uL — ABNORMAL HIGH (ref 4.22–5.81)
RDW: 15.1 % (ref 11.5–15.5)
WBC: 13.5 K/uL — ABNORMAL HIGH (ref 4.0–10.5)
nRBC: 0 % (ref 0.0–0.2)

## 2023-11-15 LAB — BASIC METABOLIC PANEL WITH GFR
Anion gap: 17 — ABNORMAL HIGH (ref 5–15)
BUN: 11 mg/dL (ref 6–20)
CO2: 22 mmol/L (ref 22–32)
Calcium: 9.2 mg/dL (ref 8.9–10.3)
Chloride: 95 mmol/L — ABNORMAL LOW (ref 98–111)
Creatinine, Ser: 0.95 mg/dL (ref 0.61–1.24)
GFR, Estimated: >60 mL/min (ref >60)
Glucose, Bld: 189 mg/dL — ABNORMAL HIGH (ref 70–99)
Potassium: 3.6 mmol/L (ref 3.5–5.1)
Sodium: 134 mmol/L — ABNORMAL LOW (ref 135–145)

## 2023-11-15 LAB — LACTIC ACID, PLASMA: Lactic Acid, Venous: 1.6 mmol/L (ref 0.5–1.9)

## 2023-11-15 LAB — HIV ANTIBODY (ROUTINE TESTING W REFLEX): HIV Screen 4th Generation wRfx: NONREACTIVE

## 2023-11-15 MED ORDER — VANCOMYCIN HCL IN DEXTROSE 1-5 GM/200ML-% IV SOLN
1000.0000 mg | Freq: Once | INTRAVENOUS | Status: AC
  Administered 2023-11-15: 1000 mg via INTRAVENOUS
  Filled 2023-11-15: qty 200

## 2023-11-15 MED ORDER — ACETAMINOPHEN 500 MG PO TABS
1000.0000 mg | ORAL_TABLET | Freq: Once | ORAL | Status: AC
  Administered 2023-11-15: 1000 mg via ORAL
  Filled 2023-11-15: qty 2

## 2023-11-15 MED ORDER — VANCOMYCIN HCL 2000 MG/400ML IV SOLN: 2000.0000 mg | Freq: Once | INTRAVENOUS | Status: DC

## 2023-11-15 MED ORDER — ONDANSETRON HCL 4 MG/2ML IJ SOLN: 4.0000 mg | Freq: Four times a day (QID) | INTRAMUSCULAR | Status: DC | PRN

## 2023-11-15 MED ORDER — ALBUTEROL SULFATE (2.5 MG/3ML) 0.083% IN NEBU: 2.5000 mg | INHALATION_SOLUTION | Freq: Four times a day (QID) | RESPIRATORY_TRACT | Status: DC | PRN

## 2023-11-15 MED ORDER — AMLODIPINE BESYLATE 5 MG PO TABS
5.0000 mg | ORAL_TABLET | Freq: Every day | ORAL | Status: DC
  Administered 2023-11-15 – 2023-11-16 (×2): 5 mg via ORAL
  Filled 2023-11-15 (×2): qty 1

## 2023-11-15 MED ORDER — ENOXAPARIN SODIUM 80 MG/0.8ML IJ SOSY
65.0000 mg | PREFILLED_SYRINGE | INTRAMUSCULAR | Status: DC
  Administered 2023-11-15: 65 mg via SUBCUTANEOUS
  Filled 2023-11-15 (×2): qty 0.65

## 2023-11-15 MED ORDER — IRBESARTAN 300 MG PO TABS
300.0000 mg | ORAL_TABLET | Freq: Every day | ORAL | Status: DC
  Administered 2023-11-15 – 2023-11-16 (×2): 300 mg via ORAL
  Filled 2023-11-15 (×2): qty 1

## 2023-11-15 MED ORDER — VANCOMYCIN HCL 1500 MG/300ML IV SOLN
1500.0000 mg | Freq: Two times a day (BID) | INTRAVENOUS | Status: DC
  Administered 2023-11-15 – 2023-11-16 (×3): 1500 mg via INTRAVENOUS
  Filled 2023-11-15 (×4): qty 300

## 2023-11-15 MED ORDER — FENTANYL CITRATE PF 50 MCG/ML IJ SOSY
50.0000 ug | PREFILLED_SYRINGE | INTRAMUSCULAR | Status: DC | PRN
  Administered 2023-11-15: 50 ug via INTRAVENOUS
  Filled 2023-11-15: qty 1

## 2023-11-15 MED ORDER — SODIUM CHLORIDE 0.9% FLUSH
3.0000 mL | Freq: Two times a day (BID) | INTRAVENOUS | Status: DC
  Administered 2023-11-15 – 2023-11-16 (×3): 3 mL via INTRAVENOUS

## 2023-11-15 MED ORDER — LACTATED RINGERS IV BOLUS
1000.0000 mL | Freq: Once | INTRAVENOUS | Status: AC
  Administered 2023-11-15: 1000 mL via INTRAVENOUS

## 2023-11-15 MED ORDER — ACETAMINOPHEN 325 MG PO TABS: 650.0000 mg | ORAL_TABLET | Freq: Four times a day (QID) | ORAL | Status: DC | PRN

## 2023-11-15 MED ORDER — SODIUM CHLORIDE 0.9 % IV SOLN
2.0000 g | Freq: Once | INTRAVENOUS | Status: AC
  Administered 2023-11-15: 2 g via INTRAVENOUS
  Filled 2023-11-15: qty 12.5

## 2023-11-15 MED ORDER — LIDOCAINE HCL (PF) 1 % IJ SOLN
30.0000 mL | Freq: Once | INTRAMUSCULAR | Status: DC
  Filled 2023-11-15: qty 30

## 2023-11-15 MED ORDER — HYDROCODONE-ACETAMINOPHEN 5-325 MG PO TABS: 1.0000 | ORAL_TABLET | Freq: Four times a day (QID) | ORAL | Status: DC | PRN

## 2023-11-15 MED ORDER — ONDANSETRON HCL 4 MG PO TABS: 4.0000 mg | ORAL_TABLET | Freq: Four times a day (QID) | ORAL | Status: DC | PRN

## 2023-11-15 MED ORDER — HYDRALAZINE HCL 20 MG/ML IJ SOLN: 10.0000 mg | INTRAMUSCULAR | Status: DC | PRN

## 2023-11-15 MED ORDER — ACETAMINOPHEN 650 MG RE SUPP
650.0000 mg | Freq: Four times a day (QID) | RECTAL | Status: DC | PRN
Start: 2023-11-15 — End: 2023-11-16

## 2023-11-15 MED ORDER — SODIUM CHLORIDE 0.9 % IV SOLN
2.0000 g | Freq: Three times a day (TID) | INTRAVENOUS | Status: DC
  Administered 2023-11-15 – 2023-11-16 (×4): 2 g via INTRAVENOUS
  Filled 2023-11-15 (×4): qty 12.5

## 2023-11-15 MED ORDER — FENTANYL CITRATE PF 50 MCG/ML IJ SOSY: 25.0000 ug | PREFILLED_SYRINGE | INTRAMUSCULAR | Status: DC | PRN

## 2023-11-15 MED ORDER — ENSURE PLUS HIGH PROTEIN PO LIQD
237.0000 mL | Freq: Two times a day (BID) | ORAL | Status: DC
  Administered 2023-11-15: 237 mL via ORAL

## 2023-11-15 MED ORDER — IOHEXOL 300 MG/ML  SOLN
100.0000 mL | Freq: Once | INTRAMUSCULAR | Status: AC | PRN
  Administered 2023-11-15: 100 mL via INTRAVENOUS

## 2023-11-15 NOTE — Progress Notes (Signed)
 Patient new admit brought to [facility] from [facility]. Patient is aaox4, denies any pain, afebrile. Patient admitted d/t boil on right scrotum. Patient oriented to room. Admission assessment completed. Dual skin assessment completed. Head to toe assessment completed. Mepilex placed on scrotum for protection. Patient started on ceFEPIme  and Vancomycin .

## 2023-11-15 NOTE — ED Provider Notes (Signed)
 Spencer Reid Provider Note   CSN: [id number] Arrival date & time: 11/15/23  0003     History Chief Complaint  Patient presents with   Recurrent Skin Infections   Abscess    Boil like nature- size of egg    HPI Spencer Reid is a 48 y.o. male presenting for chief complaint of groin infection.  He states he is having systemic fevers, chills, poor p.o. intake, nausea vomiting.  States that he had a large boil on his buttocks that he self lanced and under the skin he felt a substantial amount of ongoing swelling and irritation.  Progressive over the past week, severe symptoms today.  Systemic symptoms today with rigors..   Patient's recorded medical, surgical, social, medication list and allergies were reviewed in the Snapshot window as part of the initial history.   Review of Systems   Review of Systems  Constitutional:  Positive for fever. Negative for chills.  HENT:  Negative for ear pain and sore throat.   Eyes:  Negative for pain and visual disturbance.  Respiratory:  Negative for cough and shortness of breath.   Cardiovascular:  Negative for chest pain and palpitations.  Gastrointestinal:  Negative for abdominal pain and vomiting.  Genitourinary:  Negative for dysuria and hematuria.  Musculoskeletal:  Positive for myalgias. Negative for arthralgias, back pain and joint swelling.  Skin:  Negative for color change and rash.  Neurological:  Negative for seizures and syncope.  All other systems reviewed and are negative.   Physical Exam Updated Vital Signs BP (!) 147/100 (BP Location: Right Arm)   Pulse (!) 104   Temp 100.3 F (37.9 C) (Oral)   Resp 20   Ht 6' 2 (1.88 m)   Wt 131.5 kg   SpO2 100%   BMI 37.23 kg/m  Physical Exam Vitals and nursing note reviewed.  Constitutional:      General: He is not in acute distress.    Appearance: He is well-developed.  HENT:     Head: Normocephalic and atraumatic.  Eyes:      Conjunctiva/sclera: Conjunctivae normal.  Cardiovascular:     Rate and Rhythm: Normal rate and regular rhythm.     Heart sounds: No murmur heard. Pulmonary:     Effort: Pulmonary effort is normal. No respiratory distress.     Breath sounds: Normal breath sounds.  Abdominal:     Palpations: Abdomen is soft.     Tenderness: There is no abdominal tenderness.  Musculoskeletal:        General: Deformity (Large amount of irritation and swelling to patient's right buttocks.  Extends to the base of his scrotum involving most of his perineum.  No gross crepitus.) present. No swelling.     Cervical back: Neck supple.  Skin:    General: Skin is warm and dry.     Capillary Refill: Capillary refill takes less than 2 seconds.  Neurological:     Mental Status: He is alert.  Psychiatric:        Mood and Affect: Mood normal.      ED Course/ Medical Decision Making/ A&P    Procedures .Critical Care  Performed by: Jerral Meth, MD Authorized by: Jerral Meth, MD   Critical care provider statement:    Critical care time (minutes):  30   Critical care was necessary to treat or prevent imminent or life-threatening deterioration of the following conditions:  Sepsis   Critical care was time spent personally by me  on the following activities:  Development of treatment plan with patient or surrogate, discussions with consultants, evaluation of patient's response to treatment, examination of patient, ordering and review of laboratory studies, ordering and review of radiographic studies, ordering and performing treatments and interventions, pulse oximetry, re-evaluation of patient's condition and review of old charts    Medications Ordered in ED Medications  fentaNYL  (SUBLIMAZE ) injection 50 mcg (50 mcg Intravenous Given 11/15/23 0120)  lidocaine  (PF) (XYLOCAINE ) 1 % injection 30 mL (has no administration in time range)  vancomycin  (VANCOCIN ) IVPB 1000 mg/200 mL premix (0 mg Intravenous  Stopped 11/15/23 0403)    And  vancomycin  (VANCOCIN ) IVPB 1000 mg/200 mL premix (1,000 mg Intravenous New Bag/Given 11/15/23 0402)  acetaminophen  (TYLENOL ) tablet 1,000 mg (1,000 mg Oral Given 11/15/23 0059)  lactated ringers  bolus 1,000 mL (0 mLs Intravenous Stopped 11/15/23 0403)  lactated ringers  bolus 1,000 mL (0 mLs Intravenous Stopped 11/15/23 0403)  ceFEPIme  (MAXIPIME ) 2 g in sodium chloride  0.9 % 100 mL IVPB (0 g Intravenous Stopped 11/15/23 0339)  iohexol  (OMNIPAQUE ) 300 MG/ML solution 100 mL (100 mLs Intravenous Contrast Given 11/15/23 0224)   Medical Decision Making:   Spencer Reid is a 48 y.o. male who presented to the ED today with abdominal pain, detailed above.    Patient placed on continuous vitals and telemetry monitoring while in ED which was reviewed periodically.  Complete initial physical exam performed, notably the patient  was hemodynamically stable in no acute distress.     Reviewed and confirmed nursing documentation for past medical history, family history, social history.    Initial Assessment:   With the patient's presentation of abdominal pain, most likely diagnosis is nonspecific etiology/localized infection of the scrotum/perineum versus developing Fournier's. Other diagnoses were considered including (but not limited to) gastroenteritis, colitis, small bowel obstruction, appendicitis, cholecystitis, pancreatitis, nephrolithiasis, UTI, pyleonephritis. These are considered less likely due to history of present illness and physical exam findings.   This is most consistent with an acute life/limb threatening illness complicated by underlying chronic conditions.   Initial Plan:  CBC/CMP to evaluate for underlying infectious/metabolic etiology for patient's abdominal pain  Lipase to evaluate for pancreatitis  EKG to evaluate for cardiac source of pain  CTAB/Pelvis with contrast to evaluate for structural/surgical etiology of patients' severe abdominal pain.  Urinalysis  and repeat physical assessment to evaluate for UTI/Pyelonpehritis  Empiric management of symptoms with escalating pain control and antiemetics as needed.   Initial Study Results:   Laboratory  All laboratory results reviewed without evidence of clinically relevant pathology.   Exceptions include: Gross leukocytosis   EKG EKG was reviewed independently. Rate, rhythm, axis, intervals all examined and without medically relevant abnormality. ST segments without concerns for elevations.    Radiology All images reviewed independently. Agree with radiology report at this time.   CT ABDOMEN PELVIS W CONTRAST Result Date: 11/15/2023 CLINICAL DATA:  Acute abdominal pain, skin lesion beneath the scrotum EXAM: CT ABDOMEN AND PELVIS WITH CONTRAST TECHNIQUE: Multidetector CT imaging of the abdomen and pelvis was performed using the standard protocol following bolus administration of intravenous contrast. RADIATION DOSE REDUCTION: This exam was performed according to the departmental dose-optimization program which includes automated exposure control, adjustment of the mA and/or kV according to patient size and/or use of iterative reconstruction technique. CONTRAST:  OMNIPAQUE  IOHEXOL  300 MG/ML  SOLN COMPARISON:  None Available. FINDINGS: Lower chest: No acute abnormality. Hepatobiliary: Fatty infiltration of the liver is noted. Cholelithiasis is seen without complicating factors. Pancreas: Unremarkable.  No pancreatic ductal dilatation or surrounding inflammatory changes. Spleen: Normal in size without focal abnormality. Adrenals/Urinary Tract: Adrenal glands are within normal limits. Kidneys demonstrate a normal enhancement pattern bilaterally. No renal calculi or obstructive changes are seen. The bladder is partially distended. Stomach/Bowel: No obstructive or inflammatory changes colon are seen. The appendix is within normal limits. Small bowel and stomach are unremarkable. Vascular/Lymphatic: Aortic  atherosclerosis. No enlarged abdominal or pelvic lymph nodes. Reproductive: Prostate is unremarkable. Scrotal wall thickening is noted lungs consistent with the known inflammatory change. Other: No abdominal wall hernia or abnormality. No abdominopelvic ascites. Musculoskeletal: Inflammatory changes are noted in the left perineum inferiorly extending from just beneath the scrotum to the posterior aspect of the buttock. No discrete fluid collection is identified to suggest abscess. A few small foci of subcutaneous air are noted. IMPRESSION: Inflammatory change in the left perineum extending from the scrotum to the posterior aspect of the buttock. No discrete abscess is seen. Few small foci of air are noted within the inflammatory change. Cholelithiasis. Electronically Signed   By: Oneil Devonshire M.D.   On: 11/15/2023 02:29     Consults: Case discussed with hospital medicine.   Final Reassessment and Plan:   Patient's history of present illness and physical exam findings are most consistent with sepsis secondary to skin infection.  No evidence of Fournier's nor other drainable fluid collection on CT scan. Clinically, patient is improving with therapy at this time.  Will admit to medicine for ongoing observation in the setting of possible bacteremia  Disposition:   Based on the above findings, I believe this patient is stable for admission.    Patient/family educated about specific findings on our evaluation and explained exact reasons for admission.  Patient/family educated about clinical situation and time was allowed to answer questions.   Admission team communicated with and agreed with need for admission. Patient admitted. Patient ready to move at this time.     Emergency Department Medication Summary:   Medications  fentaNYL  (SUBLIMAZE ) injection 50 mcg (50 mcg Intravenous Given 11/15/23 0120)  lidocaine  (PF) (XYLOCAINE ) 1 % injection 30 mL (has no administration in time range)  vancomycin   (VANCOCIN ) IVPB 1000 mg/200 mL premix (0 mg Intravenous Stopped 11/15/23 0403)    And  vancomycin  (VANCOCIN ) IVPB 1000 mg/200 mL premix (1,000 mg Intravenous New Bag/Given 11/15/23 0402)  acetaminophen  (TYLENOL ) tablet 1,000 mg (1,000 mg Oral Given 11/15/23 0059)  lactated ringers  bolus 1,000 mL (0 mLs Intravenous Stopped 11/15/23 0403)  lactated ringers  bolus 1,000 mL (0 mLs Intravenous Stopped 11/15/23 0403)  ceFEPIme  (MAXIPIME ) 2 g in sodium chloride  0.9 % 100 mL IVPB (0 g Intravenous Stopped 11/15/23 0339)  iohexol  (OMNIPAQUE ) 300 MG/ML solution 100 mL (100 mLs Intravenous Contrast Given 11/15/23 0224)            Clinical Impression:  1. Cellulitis, unspecified cellulitis site      Admit   Final Clinical Impression(s) / ED Diagnoses Final diagnoses:  Cellulitis, unspecified cellulitis site    Rx / DC Orders ED Discharge Orders     None         Jerral Meth, MD 11/15/23 [phone]

## 2023-11-15 NOTE — Progress Notes (Signed)
 Pharmacy Antibiotic Note  Spencer Reid is a 48 y.o. male admitted on 11/15/2023 with SSTI of right buttock.  Pharmacy has been consulted for vancomycin  dosing.  Plan: Cefepime  2 g every 8 hours Vancomycin  1500 mg every 12 hours AUC 467 (SCr 0.95) Follow up SCr, cultures, clinical progress, and length of therapy   Height: 6' 2.02 (188 cm) Weight: 130.8 kg (288 lb 6.4 oz) IBW/kg (Calculated) : 82.24  Temp (24hrs), Avg:98.9 F (37.2 C), Min:98.1 F (36.7 C), Max:100.3 F (37.9 C)  Recent Labs  Lab 11/15/23 0118 11/15/23 0216  WBC 13.5*  --   CREATININE 0.95  --   LATICACIDVEN  --  1.6    Estimated Creatinine Clearance: 139.6 mL/min (by C-G formula based on SCr of 0.95 mg/dL).    Allergies  Allergen Reactions   Bee Venom Anaphylaxis   Thank you for allowing pharmacy to be a part of this patient's care.  Spencer Reid 11/15/2023 12:36 PM

## 2023-11-15 NOTE — H&P (Addendum)
 History and Physical    Patient: Spencer Reid FMW:[id number] DOB: 27-May-1975 DOA: 11/15/2023 DOS: the patient was seen and examined on 11/15/2023 PCP: [facility]  Patient coming from: Home  Chief Complaint:  Chief Complaint  Patient presents with   Recurrent Skin Infections   Abscess    Boil like nature- size of egg   HPI: Spencer Reid is a 48 y.o. male with medical history significant of hypertension who presents with pain and swelling of his left buttock  A small bump on the left buttock, initially the size of a pea, was noticed approximately 32 hours ago.  He attempted to squeeze it, leading it to swell the size of a golf ball.  He reported that it had started draining prior to him coming to the hospital, and was significantly painful.  Associated symptoms include subjective fevers/chills and generalized malaise. Denies ever having anything like this happen previously before.  In the emergency department patient was noted to be febrile up to 100.3 F with tachycardia, blood pressures elevated up to 155/85.  Labs significant for WBC 13.5, sodium 134, chloride 95, CO2 22, glucose 185, anion gap 17, and lactic acid 1.6.   CT abdomen/pelvis, which reported to show evidence of subcutaneous edema associated with right buttocks, without evidence of drainable fluid collection. Blood cultures x 2 were obtained.  Patient has been given acetaminophen , 2 L of lactated Ringer 's, vancomycin , cefepime , and fentanyl . . Review of Systems: As mentioned in the history of present illness. All other systems reviewed and are negative. Past Medical History:  Diagnosis Date   Hypertension    History reviewed. No pertinent surgical history. Social History:  reports that he has quit smoking. He has quit using smokeless tobacco. He reports current alcohol use. He reports current drug use. Drug: Marijuana.  Allergies  Allergen Reactions   Bee Venom Anaphylaxis    Family History   Problem Relation Age of Onset   Hypertension Mother    Alcohol abuse Father    Cerebral palsy Son     Prior to Admission medications   Medication Sig Start Date End Date Taking? Authorizing Provider  acetaminophen  (TYLENOL ) 325 MG tablet Take 2 tablets (650 mg total) by mouth every 6 (six) hours as needed. 12/08/22   Elnor Jayson LABOR, DO  amoxicillin -clavulanate (AUGMENTIN ) 875-125 MG tablet Take 1 tablet by mouth every 12 (twelve) hours. 12/08/22   Elnor Jayson A, DO  aspirin  81 MG tablet Take 1 tablet (81 mg total) by mouth daily. 11/23/13   Jegede, Olugbemiga E, MD  hydrochlorothiazide  (HYDRODIURIL ) 25 MG tablet Take 1 tablet (25 mg total) by mouth daily. 11/23/13   Jegede, Olugbemiga E, MD  lidocaine  (XYLOCAINE ) 2 % solution Use as directed 15 mLs in the mouth or throat as needed for mouth pain. Gargle and spit 12/08/22   Elnor Jayson LABOR, DO  menthol -cetylpyridinium (CEPACOL) 3 MG lozenge Take 1 lozenge (3 mg total) by mouth as needed for sore throat. 12/08/22   Elnor Jayson LABOR, DO  naproxen  (NAPROSYN ) 375 MG tablet Take 1 tablet twice daily as needed for leg pain. 10/20/22   Molpus, Norleen, MD    Physical Exam: Vitals:   11/15/23 0041 11/15/23 0457 11/15/23 0800 11/15/23 0930  BP: (!) 147/100 137/84 (!) 147/84 (!) 155/85  Pulse:  79 83 85  Resp: 20 20  18   Temp:  98.3 F (36.8 C)  98.1 F (36.7 C)  TempSrc:  Oral    SpO2:  98% 97% 98%  Weight:      Height:       Constitutional: Male who appears to be in no acute distress currently laying on his right side Eyes: PERRL, lids and conjunctivae normal ENMT: Mucous membranes are moist.  .Normal dentition.  Neck: normal, supple,  Respiratory: clear to auscultation bilaterally, no wheezing, no crackles. Normal respiratory effort. No accessory muscle use.  Cardiovascular: Regular rate and rhythm, no murmurs / rubs / gallops. No extremity edema. 2+ pedal pulses.   Abdomen: no tenderness, no masses palpated.  Bowel sounds positive.    Musculoskeletal: no clubbing / cyanosis. No joint deformity upper and lower extremities. Good ROM, no contractures. Normal muscle tone.  Skin: Erythema and swelling noted of the left buttock present(images on file). Neurologic: CN 2-12 grossly intact. Strength 5/5 in all 4.  Psychiatric: Normal judgment and insight. Alert and oriented x 3. Normal mood.   Data Reviewed:  EKG reveals sinus tachycardia at 101 bpm with PVC reviewed labs, imaging, and pertinent as documented.  Assessment and Plan:  Cellulitis of left buttock Acute.  Patient presents with swelling of the right buttock.  Noted to have low-grade fever of 100.3 F and mild tachycardia CT scan imaging noted inflammatory changes of the left perineum extending from the scrotum to the posterior aspect of the buttock without discrete abscess seen, and small foci of air noted within the inflammatory changes.  Blood cultures were obtained.  Patient was noted on empiric antibiotics of vancomycin  and cefepime . - Admit to a medical telemetry bed - Follow-up blood cultures - Continue empiric antibiotics of vancomycin  and cefepime .  De-escalate when deemed medically appropriate - Hydrocodone /Fentanyl  IV as needed for moderate to pain  Leukocytosis WBC elevated at 13.5.  Lactic acid was reassuring at 1.6. - Recheck CBC tomorrow morning  Essential hypertension Blood pressures elevated up to 155/85.  Home medication regimen includes amlodipine  5 mg daily, olmesartan 40 mg daily, and hydrochlorothiazide  25 mg daily. - Held hydrochlorothiazide    - Resume amlodipine  and pharmacy substitution of irbesartan  for olmesartan  Hyponatremia Hypochloremia Acute.  Admission sodium noted to be 134 and chloride 95.  Thought likely secondary to patient being on a diuretic in the setting of acute infection.  Patient had been given bolus of IV fluids. - Recheck BMP in a.m.  Obesity BMI 37.01 kg/m  DVT prophylaxis: Lovenox  Advance Care Planning:    Code Status: Full Code   Consults: None  Family Communication: None requested  Severity of Illness: The appropriate patient status for this patient is INPATIENT. Inpatient status is judged to be reasonable and necessary in order to provide the required intensity of service to ensure the patient's safety. The patient's presenting symptoms, physical exam findings, and initial radiographic and laboratory data in the context of their chronic comorbidities is felt to place them at high risk for further clinical deterioration. Furthermore, it is not anticipated that the patient will be medically stable for discharge from the hospital within 2 midnights of admission.   * I certify that at the point of admission it is my clinical judgment that the patient will require inpatient hospital care spanning beyond 2 midnights from the point of admission due to high intensity of service, high risk for further deterioration and high frequency of surveillance required.*  Author: Maximino DELENA Sharps, MD 11/15/2023 10:31 AM  For on call review [email or web address].

## 2023-11-15 NOTE — Plan of Care (Signed)
  Problem: Education: Goal: Knowledge of General Education information will improve Description: Including pain rating scale, medication(s)/side effects and non-pharmacologic comfort measures 11/15/2023 1839 by Casimir Keturah LABOR, RN Outcome: Progressing 11/15/2023 0932 by Casimir Keturah LABOR, RN Outcome: Progressing   Problem: Health Behavior/Discharge Planning: Goal: Ability to manage health-related needs will improve 11/15/2023 1839 by Casimir Keturah LABOR, RN Outcome: Progressing 11/15/2023 0932 by Casimir Keturah LABOR, RN Outcome: Progressing   Problem: Clinical Measurements: Goal: Ability to maintain clinical measurements within normal limits will improve 11/15/2023 1839 by Casimir Keturah LABOR, RN Outcome: Progressing 11/15/2023 0932 by Casimir Keturah LABOR, RN Outcome: Progressing Goal: Will remain free from infection 11/15/2023 1839 by Casimir Keturah LABOR, RN Outcome: Progressing 11/15/2023 0932 by Casimir Keturah LABOR, RN Outcome: Progressing Goal: Diagnostic test results will improve 11/15/2023 1839 by Casimir Keturah LABOR, RN Outcome: Progressing 11/15/2023 0932 by Casimir Keturah LABOR, RN Outcome: Progressing Goal: Respiratory complications will improve 11/15/2023 1839 by Casimir Keturah LABOR, RN Outcome: Progressing 11/15/2023 0932 by Casimir Keturah LABOR, RN Outcome: Progressing Goal: Cardiovascular complication will be avoided 11/15/2023 1839 by Casimir Keturah LABOR, RN Outcome: Progressing 11/15/2023 0932 by Casimir Keturah LABOR, RN Outcome: Progressing   Problem: Activity: Goal: Risk for activity intolerance will decrease 11/15/2023 1839 by Casimir Keturah LABOR, RN Outcome: Progressing 11/15/2023 0932 by Casimir Keturah LABOR, RN Outcome: Progressing   Problem: Nutrition: Goal: Adequate nutrition will be maintained 11/15/2023 1839 by Casimir Keturah LABOR, RN Outcome: Progressing 11/15/2023 0932 by Casimir Keturah LABOR, RN Outcome: Progressing   Problem: Coping: Goal: Level of anxiety will  decrease 11/15/2023 1839 by Casimir Keturah LABOR, RN Outcome: Progressing 11/15/2023 0932 by Casimir Keturah LABOR, RN Outcome: Progressing   Problem: Elimination: Goal: Will not experience complications related to bowel motility 11/15/2023 1839 by Casimir Keturah LABOR, RN Outcome: Progressing 11/15/2023 0932 by Casimir Keturah LABOR, RN Outcome: Progressing Goal: Will not experience complications related to urinary retention 11/15/2023 1839 by Casimir Keturah LABOR, RN Outcome: Progressing 11/15/2023 0932 by Casimir Keturah LABOR, RN Outcome: Progressing   Problem: Pain Managment: Goal: General experience of comfort will improve and/or be controlled 11/15/2023 1839 by Casimir Keturah LABOR, RN Outcome: Progressing 11/15/2023 0932 by Casimir Keturah LABOR, RN Outcome: Progressing   Problem: Safety: Goal: Ability to remain free from injury will improve 11/15/2023 1839 by Casimir Keturah LABOR, RN Outcome: Progressing 11/15/2023 0932 by Casimir Keturah LABOR, RN Outcome: Progressing   Problem: Skin Integrity: Goal: Risk for impaired skin integrity will decrease 11/15/2023 1839 by Casimir Keturah LABOR, RN Outcome: Progressing 11/15/2023 0932 by Casimir Keturah LABOR, RN Outcome: Progressing   Problem: Clinical Measurements: Goal: Ability to avoid or minimize complications of infection will improve Outcome: Progressing   Problem: Skin Integrity: Goal: Skin integrity will improve Outcome: Progressing

## 2023-11-15 NOTE — ED Notes (Signed)
 [facility] called for transport.

## 2023-11-15 NOTE — Progress Notes (Signed)
 Hospitalist Transfer Note:    Nursing staff, Please call [facility] Admits & Consults System-Wide number on Amion [phone]) as soon as patient's arrival, so appropriate admitting provider can evaluate the pt.   Transferring facility: [facility] Requesting provider: Dr. Jerral (EDP at [facility]) Reason for transfer: admission for further evaluation and management of sepsis due to cellulitis of the right buttock.     48 year old male with history of essential hypertension, who presented to [facility] ED complaining of erythema, pain, swelling associate with the right buttock.   The patient notes development of a boil on the right buttock approximately 4 to 5 days ago.  Subsequently, two days ago, he notes that he was able to express the contents of the boil, which we purulent in nature.  However, over the last 2 days, he has noted worsening erythema, pain, swelling associated with this area of the right buttock, extending inferiorly.  Not associated with any numbness or paresthesias.  However, he does report subjective fever and chills over the last 1 to 2 days.  On EDPs exam, no evidence of crepitus.  Vital signs in the ED were notable for the following: Temperature max 100.3, heart rates in the low 100s; systolic blood pressures in the 140s mmHg.  Labs were notable for CBC which demonstrated white blood cell count 13,500.  Lactic acid 1.6.  Blood cultures x 2 were collected.  Imaging notable for CT abdomen/pelvis, which reported to show evidence of subcutaneous edema associated with right buttocks, without evidence of drainable fluid collection.  Medications administered prior to transfer included the following: IV vancomycin , cefepime .  Subsequently, I accepted this patient for transfer for inpatient admission to a med/tele bed at [facility] or [facility] (first available) for further work-up and management of the above.      Eva Pore, DO Hospitalist

## 2023-11-15 NOTE — TOC CM/SW Note (Signed)
 Transition of Care [facility]) - Inpatient Brief Assessment   Patient Details  Name: Spencer Reid MRN: [id number] Date of Birth: 1976/04/28  Transition of Care [facility]) CM/SW Contact:    Lauraine FORBES Saa, LCSW Phone Number: 11/15/2023, 11:48 AM   Clinical Narrative:  11:48 AM Per chart review, patient resides at home alone. Patient has a PCP but does not have insurance. Financial counseling has been consulted for further assistance. Patient does not have SNF/HH/DME history. Patient's preferred pharmacy's are [facility] [address] and [facility] [address]. No other TOC needs were identified at this time. TOC will continue to follow and be available to assist.  Transition of Care Asessment: Insurance and Status: Selfpay Patient has primary care physician: Yes Home environment has been reviewed: Private Residence Prior level of function:: N/A Prior/Current Home Services: No current home services Social Drivers of Health Review: SDOH reviewed no interventions necessary Readmission risk has been reviewed: Yes Transition of care needs: no transition of care needs at this time

## 2023-11-15 NOTE — ED Triage Notes (Signed)
 Patient c/o of boil- skin irritation that started 2-3 days ago. Initially pea- size and after irritating it ( squeezed) increased to egg size.Elongated like banana.  Located  under scrotum.  Endorses oozing from area.  Unable to eat. Felt lightheaded. Faint. Unable to tolerate sitting. Tx with antibiotic cream.  And applied - boil cream on Friday

## 2023-11-15 NOTE — ED Notes (Signed)
 ED TO INPATIENT HANDOFF REPORT  ED Nurse Name and Phone #: Sharlet Lemmings RN [phone]  S Name/Age/Gender Spencer Reid 48 y.o. male Room/Bed: MH07/MH07  Code Status   Code Status: Prior  Home/SNF/Other Home Patient oriented to: self, place, time, and situation Is this baseline? Yes   Triage Complete: Triage complete  Chief Complaint Cellulitis [L03.90]  Triage Note Patient c/o of boil- skin irritation that started 2-3 days ago. Initially pea- size and after irritating it ( squeezed) increased to egg size.Elongated like banana.  Located  under scrotum.  Endorses oozing from area.  Unable to eat. Felt lightheaded. Faint. Unable to tolerate sitting. Tx with antibiotic cream.  And applied - boil cream on Friday    Allergies Allergies  Allergen Reactions   Bee Venom Anaphylaxis    Level of Care/Admitting Diagnosis ED Disposition     ED Disposition  Admit   Condition  --   Comment  Hospital Area: [facility] [[id number]]  Level of Care: Telemetry [5]  Admit to tele based on following criteria: Monitor for Ischemic changes  May admit patient to [facility] or [facility] if equivalent level of care is available:: Yes  Interfacility transfer: Yes  Covid Evaluation: Asymptomatic - no recent exposure (last 10 days) testing not required  Diagnosis: Cellulitis [807680]  Admitting Physician: HOWERTER, JUSTIN B [[id number]]  Attending Physician: HOWERTER, JUSTIN B [[id number]]  Certification:: I certify this patient will need inpatient services for at least 2 midnights  Expected Medical Readiness: 11/17/2023          B Medical/Surgery History Past Medical History:  Diagnosis Date   Hypertension    History reviewed. No pertinent surgical history.   A IV Location/Drains/Wounds Patient Lines/Drains/Airways Status     Active Line/Drains/Airways     Name Placement date Placement time Site Days   Peripheral IV 11/15/23 20 G 1 Left;Anterior Forearm  11/15/23  0120  Forearm  less than 1            Intake/Output Last 24 hours  Intake/Output Summary (Last 24 hours) at 11/15/2023 0449 Last data filed at 11/15/2023 0406 Gross per 24 hour  Intake 2302.6 ml  Output 1200 ml  Net 1102.6 ml    Labs/Imaging Results for orders placed or performed during the hospital encounter of 11/15/23 (from the past 48 hours)  CBC with Differential     Status: Abnormal   Collection Time: 11/15/23  1:18 AM  Result Value Ref Range   WBC 13.5 (H) 4.0 - 10.5 K/uL   RBC 6.70 (H) 4.22 - 5.81 MIL/uL   Hemoglobin 14.0 13.0 - 17.0 g/dL   HCT 56.1 60.9 - 47.9 %   MCV 65.4 (L) 80.0 - 100.0 fL   MCH 20.9 (L) 26.0 - 34.0 pg   MCHC 32.0 30.0 - 36.0 g/dL   RDW 84.8 88.4 - 84.4 %   Platelets 162 150 - 400 K/uL    Comment: REPEATED TO VERIFY   nRBC 0.0 0.0 - 0.2 %   Neutrophils Relative % 68 %   Neutro Abs 9.2 (H) 1.7 - 7.7 K/uL   Lymphocytes Relative 23 %   Lymphs Abs 3.1 0.7 - 4.0 K/uL   Monocytes Relative 9 %   Monocytes Absolute 1.2 (H) 0.1 - 1.0 K/uL   Eosinophils Relative 0 %   Eosinophils Absolute 0.0 0.0 - 0.5 K/uL   Basophils Relative 0 %   Basophils Absolute 0.0 0.0 - 0.1 K/uL   Immature Granulocytes 0 %  Abs Immature Granulocytes 0.03 0.00 - 0.07 K/uL    Comment: Performed at [facility], [address] [place], [place] [address]  Basic metabolic panel     Status: Abnormal   Collection Time: 11/15/23  1:18 AM  Result Value Ref Range   Sodium 134 (L) 135 - 145 mmol/L   Potassium 3.6 3.5 - 5.1 mmol/L   Chloride 95 (L) 98 - 111 mmol/L   CO2 22 22 - 32 mmol/L   Glucose, Bld 189 (H) 70 - 99 mg/dL    Comment: Glucose reference range applies only to samples taken after fasting for at least 8 hours.   BUN 11 6 - 20 mg/dL   Creatinine, Ser 9.04 0.61 - 1.24 mg/dL   Calcium 9.2 8.9 - 89.6 mg/dL   GFR, Estimated >39 >39 mL/min    Comment: (NOTE) Calculated using the CKD-EPI Creatinine Equation (2021)    Anion gap 17 (H) 5 - 15     Comment: Performed at [facility], [address] [place], [place] [address]  Lactic acid, plasma     Status: None   Collection Time: 11/15/23  2:16 AM  Result Value Ref Range   Lactic Acid, Venous 1.6 0.5 - 1.9 mmol/L    Comment: Performed at [facility], [address] [place], [place] [address]   CT ABDOMEN PELVIS W CONTRAST Result Date: 11/15/2023 CLINICAL DATA:  Acute abdominal pain, skin lesion beneath the scrotum EXAM: CT ABDOMEN AND PELVIS WITH CONTRAST TECHNIQUE: Multidetector CT imaging of the abdomen and pelvis was performed using the standard protocol following bolus administration of intravenous contrast. RADIATION DOSE REDUCTION: This exam was performed according to the departmental dose-optimization program which includes automated exposure control, adjustment of the mA and/or kV according to patient size and/or use of iterative reconstruction technique. CONTRAST:  OMNIPAQUE  IOHEXOL  300 MG/ML  SOLN COMPARISON:  None Available. FINDINGS: Lower chest: No acute abnormality. Hepatobiliary: Fatty infiltration of the liver is noted. Cholelithiasis is seen without complicating factors. Pancreas: Unremarkable. No pancreatic ductal dilatation or surrounding inflammatory changes. Spleen: Normal in size without focal abnormality. Adrenals/Urinary Tract: Adrenal glands are within normal limits. Kidneys demonstrate a normal enhancement pattern bilaterally. No renal calculi or obstructive changes are seen. The bladder is partially distended. Stomach/Bowel: No obstructive or inflammatory changes colon are seen. The appendix is within normal limits. Small bowel and stomach are unremarkable. Vascular/Lymphatic: Aortic atherosclerosis. No enlarged abdominal or pelvic lymph nodes. Reproductive: Prostate is unremarkable. Scrotal wall thickening is noted lungs consistent with the known inflammatory change. Other: No abdominal wall hernia or abnormality. No abdominopelvic ascites.  Musculoskeletal: Inflammatory changes are noted in the left perineum inferiorly extending from just beneath the scrotum to the posterior aspect of the buttock. No discrete fluid collection is identified to suggest abscess. A few small foci of subcutaneous air are noted. IMPRESSION: Inflammatory change in the left perineum extending from the scrotum to the posterior aspect of the buttock. No discrete abscess is seen. Few small foci of air are noted within the inflammatory change. Cholelithiasis. Electronically Signed   By: Oneil Devonshire M.D.   On: 11/15/2023 02:29    Pending Labs Unresulted Labs (From admission, onward)     Start     Ordered   11/15/23 0150  Blood culture (routine x 2)  BLOOD CULTURE X 2,   STAT      11/15/23 0150   11/15/23 0150  Lactic acid, plasma  (Lactic  Acid)  Now then every 2 hours,   R (with STAT occurrences)      11/15/23 0150            Vitals/Pain Today's Vitals   11/15/23 0041 11/15/23 0059 11/15/23 0120 11/15/23 0202  BP: (!) 147/100     Pulse:      Resp: 20     Temp:      TempSrc:      SpO2:      Weight:      Height:      PainSc:  9  9  0-No pain    Isolation Precautions No active isolations  Medications Medications  fentaNYL  (SUBLIMAZE ) injection 50 mcg (50 mcg Intravenous Given 11/15/23 0120)  lidocaine  (PF) (XYLOCAINE ) 1 % injection 30 mL (has no administration in time range)  vancomycin  (VANCOCIN ) IVPB 1000 mg/200 mL premix (0 mg Intravenous Stopped 11/15/23 0403)    And  vancomycin  (VANCOCIN ) IVPB 1000 mg/200 mL premix (1,000 mg Intravenous New Bag/Given 11/15/23 0402)  acetaminophen  (TYLENOL ) tablet 1,000 mg (1,000 mg Oral Given 11/15/23 0059)  lactated ringers  bolus 1,000 mL (0 mLs Intravenous Stopped 11/15/23 0403)  lactated ringers  bolus 1,000 mL (0 mLs Intravenous Stopped 11/15/23 0403)  ceFEPIme  (MAXIPIME ) 2 g in sodium chloride  0.9 % 100 mL IVPB (0 g Intravenous Stopped 11/15/23 0339)  iohexol  (OMNIPAQUE ) 300 MG/ML solution 100 mL (100  mLs Intravenous Contrast Given 11/15/23 0224)    Mobility walks     Focused Assessments Skin: Skin infection   R Recommendations: See Admitting Provider Note  Report given to:   Additional Notes:

## 2023-11-15 NOTE — Plan of Care (Signed)

## 2023-11-16 ENCOUNTER — Other Ambulatory Visit (HOSPITAL_COMMUNITY): Payer: Self-pay

## 2023-11-16 LAB — BASIC METABOLIC PANEL WITH GFR
Anion gap: 10 (ref 5–15)
BUN: 8 mg/dL (ref 6–20)
CO2: 25 mmol/L (ref 22–32)
Calcium: 9 mg/dL (ref 8.9–10.3)
Chloride: 101 mmol/L (ref 98–111)
Creatinine, Ser: 1.01 mg/dL (ref 0.61–1.24)
GFR, Estimated: >60 mL/min (ref >60)
Glucose, Bld: 171 mg/dL — ABNORMAL HIGH (ref 70–99)
Potassium: 4.2 mmol/L (ref 3.5–5.1)
Sodium: 136 mmol/L (ref 135–145)

## 2023-11-16 LAB — CBC
HCT: 41.6 % (ref 39.0–52.0)
Hemoglobin: 12.9 g/dL — ABNORMAL LOW (ref 13.0–17.0)
MCH: 20.9 pg — ABNORMAL LOW (ref 26.0–34.0)
MCHC: 31 g/dL (ref 30.0–36.0)
MCV: 67.5 fL — ABNORMAL LOW (ref 80.0–100.0)
Platelets: 140 K/uL — ABNORMAL LOW (ref 150–400)
RBC: 6.16 MIL/uL — ABNORMAL HIGH (ref 4.22–5.81)
RDW: 14.5 % (ref 11.5–15.5)
WBC: 9.1 K/uL (ref 4.0–10.5)
nRBC: 0 % (ref 0.0–0.2)

## 2023-11-16 MED ORDER — HYDROCODONE-ACETAMINOPHEN 5-325 MG PO TABS
1.0000 | ORAL_TABLET | Freq: Four times a day (QID) | ORAL | 0 refills | Status: AC | PRN
  Filled 2023-11-16: qty 15, 4d supply, fill #0

## 2023-11-16 MED ORDER — ONDANSETRON HCL 4 MG/2ML IJ SOLN: 4.0000 mg | Freq: Four times a day (QID) | INTRAMUSCULAR | Status: DC | PRN

## 2023-11-16 MED ORDER — IPRATROPIUM-ALBUTEROL 0.5-2.5 (3) MG/3ML IN SOLN: 3.0000 mL | RESPIRATORY_TRACT | Status: DC | PRN

## 2023-11-16 MED ORDER — SENNOSIDES-DOCUSATE SODIUM 8.6-50 MG PO TABS: 1.0000 | ORAL_TABLET | Freq: Every evening | ORAL | Status: DC | PRN

## 2023-11-16 MED ORDER — SULFAMETHOXAZOLE-TRIMETHOPRIM 800-160 MG PO TABS
1.0000 | ORAL_TABLET | Freq: Two times a day (BID) | ORAL | 0 refills | Status: AC
  Filled 2023-11-16: qty 14, 7d supply, fill #0

## 2023-11-16 MED ORDER — METOPROLOL TARTRATE 5 MG/5ML IV SOLN: 5.0000 mg | INTRAVENOUS | Status: DC | PRN

## 2023-11-16 MED ORDER — HYDRALAZINE HCL 20 MG/ML IJ SOLN: 10.0000 mg | INTRAMUSCULAR | Status: DC | PRN

## 2023-11-16 NOTE — Discharge Summary (Signed)
 Physician Discharge Summary  Spencer Reid FMW:[id number] DOB: 05-25-1975 DOA: 11/15/2023  PCP: [facility]  Admit date: 11/15/2023 Discharge date: 11/16/2023  Admitted From: Home Disposition: Home  Recommendations for Outpatient Follow-up:  Follow up with PCP in 1 weeks Please obtain BMP/CBC in one week your next doctors visit.  7 days of p.o. Bactrim    Discharge Condition: Stable CODE STATUS: Full code Diet recommendation: Low-salt  Brief/Interim Summary: Brief Narrative:  48 y.o. male with medical history significant of hypertension who presents with pain and swelling of his left buttock.  CT abdomen/pelvis, which reported to show evidence of subcutaneous edema associated with right buttocks, without evidence of drainable fluid collection. Blood cultures x 2 were obtained.  Patient has been given acetaminophen , 2 L of lactated Ringer 's, vancomycin , cefepime , and fentanyl .  During the hospitalization patient received 3 doses of cefepime  and vancomycin .  He started feeling significantly better the following day with minimal swelling and drainage.  Patient would like to be discharged today with outpatient follow-up.  Have advised him to follow-up outpatient with PCP next 5 days to ensure his symptoms continue to improve.   Assessment & Plan:  Principal Problem:   Cellulitis Active Problems:   Leukocytosis   Essential hypertension, benign   Hyponatremia   Hypochloremia   Obesity (BMI 30-39.9)      Sepsis secondary to cellulitis of left buttock Sepsis physiology is improving.  CT does not show any evidence of drainable abscess or Fournier's gangrene.  Significantly improved on IV vancomycin  and cefepime .  Will transition to p.o. Bactrim  upon discharge.  Advised him to have a close outpatient follow-up with his PCP within the next 5 to 7 days.   Leukocytosis WBC elevated at 13.5.  Now resolved   Essential hypertension, uncontrolled Continue Avapro ,  Norvasc .    Hyponatremia Hypochloremia Resolved   Obesity BMI 37.01 kg/m   DVT prophylaxis: Lovenox   Code Status: Full Code  Family Communication:   Discharge   Subjective:  Feeling well wishing to go home  Examination:  General exam: Appears calm and comfortable  Respiratory system: Clear to auscultation. Respiratory effort normal. Cardiovascular system: S1 & S2 heard, RRR. No JVD, murmurs, rubs, gallops or clicks. No pedal edema. Gastrointestinal system: Abdomen is nondistended, soft and nontender. No organomegaly or masses felt. Normal bowel sounds heard. Central nervous system: Alert and oriented. No focal neurological deficits. Extremities: Symmetric 5 x 5 power. Skin: Small area of swelling around his buttock area.  Much better and improved. Psychiatry: Judgement and insight appear normal. Mood & affect appropriate.    Discharge Diagnoses:  Principal Problem:   Cellulitis Active Problems:   Leukocytosis   Essential hypertension, benign   Hyponatremia   Hypochloremia   Obesity (BMI 30-39.9)      Discharge Exam: Vitals:   11/16/23 0454 11/16/23 0946  BP: 129/61 (!) 182/102  Pulse: 66 76  Resp: 18   Temp: 98.1 F (36.7 C) 98.3 F (36.8 C)  SpO2: 96% 100%   Vitals:   11/15/23 1712 11/15/23 2045 11/16/23 0454 11/16/23 0946  BP: (!) 141/84 (!) 153/104 129/61 (!) 182/102  Pulse: 84 80 66 76  Resp: 17 18 18    Temp: 98.4 F (36.9 C) 98.4 F (36.9 C) 98.1 F (36.7 C) 98.3 F (36.8 C)  TempSrc: Oral  Oral Oral  SpO2: 99% 99% 96% 100%  Weight:      Height:          Discharge Instructions   Allergies as  of 11/16/2023       Reactions   Bee Venom Anaphylaxis        Medication List     TAKE these medications    acetaminophen  325 MG tablet Commonly known as: Tylenol  Take 2 tablets (650 mg total) by mouth every 6 (six) hours as needed.   amLODipine  5 MG tablet Commonly known as: NORVASC  Take 5 mg by mouth in the morning.   aspirin   81 MG tablet Take 1 tablet (81 mg total) by mouth daily. What changed: when to take this   hydrochlorothiazide  25 MG tablet Commonly known as: HYDRODIURIL  Take 1 tablet (25 mg total) by mouth daily. What changed: when to take this   HYDROcodone -acetaminophen  5-325 MG tablet Commonly known as: NORCO/VICODIN Take 1 tablet by mouth every 6 (six) hours as needed for moderate pain (pain score 4-6) or severe pain (pain score 7-10).   olmesartan 40 MG tablet Commonly known as: BENICAR Take 40 mg by mouth in the morning.   rosuvastatin 10 MG tablet Commonly known as: CRESTOR Take 10 mg by mouth in the morning.   sulfamethoxazole -trimethoprim  800-160 MG tablet Commonly known as: Bactrim  DS Take 1 tablet by mouth 2 (two) times daily for 7 days.   tadalafil  20 MG tablet Commonly known as: CIALIS  Take 20 mg by mouth as needed.        Allergies  Allergen Reactions   Bee Venom Anaphylaxis    You were cared for by a hospitalist during your hospital stay. If you have any questions about your discharge medications or the care you received while you were in the hospital after you are discharged, you can call the unit and asked to speak with the hospitalist on call if the hospitalist that took care of you is not available. Once you are discharged, your primary care physician will handle any further medical issues. Please note that no refills for any discharge medications will be authorized once you are discharged, as it is imperative that you return to your primary care physician (or establish a relationship with a primary care physician if you do not have one) for your aftercare needs so that they can reassess your need for medications and monitor your lab values.  You were cared for by a hospitalist during your hospital stay. If you have any questions about your discharge medications or the care you received while you were in the hospital after you are discharged, you can call the unit and  asked to speak with the hospitalist on call if the hospitalist that took care of you is not available. Once you are discharged, your primary care physician will handle any further medical issues. Please note that NO REFILLS for any discharge medications will be authorized once you are discharged, as it is imperative that you return to your primary care physician (or establish a relationship with a primary care physician if you do not have one) for your aftercare needs so that they can reassess your need for medications and monitor your lab values.  Please request your Prim.MD to go over all Hospital Tests and Procedure/Radiological results at the follow up, please get all Hospital records sent to your Prim MD by signing hospital release before you go home.  Get CBC, CMP, 2 view Chest X ray checked  by Primary MD during your next visit or SNF MD in 5-7 days ( we routinely change or add medications that can affect your baseline labs and fluid status, therefore we recommend that  you get the mentioned basic workup next visit with your PCP, your PCP may decide not to get them or add new tests based on their clinical decision)  On your next visit with your primary care physician please Get Medicines reviewed and adjusted.  If you experience worsening of your admission symptoms, develop shortness of breath, life threatening emergency, suicidal or homicidal thoughts you must seek medical attention immediately by calling 911 or calling your MD immediately  if symptoms less severe.  You Must read complete instructions/literature along with all the possible adverse reactions/side effects for all the Medicines you take and that have been prescribed to you. Take any new Medicines after you have completely understood and accpet all the possible adverse reactions/side effects.   Do not drive, operate heavy machinery, perform activities at heights, swimming or participation in water activities or provide baby sitting  services if your were admitted for syncope or siezures until you have seen by Primary MD or a Neurologist and advised to do so again.  Do not drive when taking Pain medications.   Procedures/Studies: CT ABDOMEN PELVIS W CONTRAST Result Date: 11/15/2023 CLINICAL DATA:  Acute abdominal pain, skin lesion beneath the scrotum EXAM: CT ABDOMEN AND PELVIS WITH CONTRAST TECHNIQUE: Multidetector CT imaging of the abdomen and pelvis was performed using the standard protocol following bolus administration of intravenous contrast. RADIATION DOSE REDUCTION: This exam was performed according to the departmental dose-optimization program which includes automated exposure control, adjustment of the mA and/or kV according to patient size and/or use of iterative reconstruction technique. CONTRAST:  OMNIPAQUE  IOHEXOL  300 MG/ML  SOLN COMPARISON:  None Available. FINDINGS: Lower chest: No acute abnormality. Hepatobiliary: Fatty infiltration of the liver is noted. Cholelithiasis is seen without complicating factors. Pancreas: Unremarkable. No pancreatic ductal dilatation or surrounding inflammatory changes. Spleen: Normal in size without focal abnormality. Adrenals/Urinary Tract: Adrenal glands are within normal limits. Kidneys demonstrate a normal enhancement pattern bilaterally. No renal calculi or obstructive changes are seen. The bladder is partially distended. Stomach/Bowel: No obstructive or inflammatory changes colon are seen. The appendix is within normal limits. Small bowel and stomach are unremarkable. Vascular/Lymphatic: Aortic atherosclerosis. No enlarged abdominal or pelvic lymph nodes. Reproductive: Prostate is unremarkable. Scrotal wall thickening is noted lungs consistent with the known inflammatory change. Other: No abdominal wall hernia or abnormality. No abdominopelvic ascites. Musculoskeletal: Inflammatory changes are noted in the left perineum inferiorly extending from just beneath the scrotum to the  posterior aspect of the buttock. No discrete fluid collection is identified to suggest abscess. A few small foci of subcutaneous air are noted. IMPRESSION: Inflammatory change in the left perineum extending from the scrotum to the posterior aspect of the buttock. No discrete abscess is seen. Few small foci of air are noted within the inflammatory change. Cholelithiasis. Electronically Signed   By: Oneil Devonshire M.D.   On: 11/15/2023 02:29     The results of significant diagnostics from this hospitalization (including imaging, microbiology, ancillary and laboratory) are listed below for reference.     Microbiology: Recent Results (from the past 240 hours)  Blood culture (routine x 2)     Status: None (Preliminary result)   Collection Time: 11/15/23  1:20 AM   Specimen: BLOOD LEFT FOREARM  Result Value Ref Range Status   Specimen Description   Final    BLOOD LEFT FOREARM Performed at [facility], [address], [place], [place] [address]    Special Requests   Final  BOTTLES DRAWN AEROBIC AND ANAEROBIC Blood Culture adequate volume Performed at [facility], [address] [place], [place] [address]    Culture   Final    NO GROWTH < 24 HOURS Performed at [facility], [address] [place], [place] [address]    Report Status PENDING  Incomplete  Blood culture (routine x 2)     Status: None (Preliminary result)   Collection Time: 11/15/23  2:38 AM   Specimen: BLOOD RIGHT HAND  Result Value Ref Range Status   Specimen Description   Final    BLOOD RIGHT HAND Performed at [facility], [address] [place], [place] [address]    Special Requests   Final    BOTTLES DRAWN AEROBIC AND ANAEROBIC Blood Culture results may not be optimal due to an inadequate volume of blood received in culture bottles Performed at [facility], [address] [place], [place] [address]    Culture   Final    NO GROWTH < 24 HOURS Performed at [facility], [address], [place], [place] [address]    Report Status PENDING  Incomplete     Labs: BNP (last 3 results) No results for input(s): BNP in the last 8760 hours. Basic Metabolic Panel: Recent Labs  Lab 11/15/23 0118 11/16/23 0527  NA 134* 136  K 3.6 4.2  CL 95* 101  CO2 22 25  GLUCOSE 189* 171*  BUN 11 8  CREATININE 0.95 1.01  CALCIUM 9.2 9.0   Liver Function Tests: No results for input(s): AST, ALT, ALKPHOS, BILITOT, PROT, ALBUMIN in the last 168 hours. No results for input(s): LIPASE, AMYLASE in the last 168 hours. No results for input(s): AMMONIA in the last 168 hours. CBC: Recent Labs  Lab 11/15/23 0118 11/16/23 0527  WBC 13.5* 9.1  NEUTROABS 9.2*  --   HGB 14.0 12.9*  HCT 43.8 41.6  MCV 65.4* 67.5*  PLT 162 140*   Cardiac Enzymes: No results for input(s): CKTOTAL, CKMB, CKMBINDEX, TROPONINI in the last 168 hours. BNP: Invalid input(s): POCBNP CBG: No results for input(s): GLUCAP in the last 168 hours. D-Dimer No results for input(s): DDIMER in the last 72 hours. Hgb A1c No results for input(s): HGBA1C in the last 72 hours. Lipid Profile No results for input(s): CHOL, HDL, LDLCALC, TRIG, CHOLHDL, LDLDIRECT in the last 72 hours. Thyroid function studies No results for input(s): TSH, T4TOTAL, T3FREE, THYROIDAB in the last 72 hours.  Invalid input(s): FREET3 Anemia work up No results for input(s): VITAMINB12, FOLATE, FERRITIN, TIBC, IRON, RETICCTPCT in the last 72 hours. Urinalysis    Component Value Date/Time   COLORURINE YELLOW 04/08/2018 1108   APPEARANCEUR CLEAR 04/08/2018 1108   LABSPEC 1.020 04/08/2018 1108   PHURINE 7.0 04/08/2018 1108   GLUCOSEU NEGATIVE 04/08/2018 1108   HGBUR NEGATIVE 04/08/2018 1108   BILIRUBINUR NEGATIVE 04/08/2018 1108   KETONESUR NEGATIVE 04/08/2018 1108   PROTEINUR NEGATIVE 04/08/2018 1108   UROBILINOGEN 0.2 12/31/2010 1058    NITRITE NEGATIVE 04/08/2018 1108   LEUKOCYTESUR NEGATIVE 04/08/2018 1108   Sepsis Labs Recent Labs  Lab 11/15/23 0118 11/16/23 0527  WBC 13.5* 9.1   Microbiology Recent Results (from the past 240 hours)  Blood culture (routine x 2)     Status: None (Preliminary result)   Collection Time: 11/15/23  1:20 AM   Specimen: BLOOD LEFT FOREARM  Result Value Ref Range Status   Specimen Description   Final  BLOOD LEFT FOREARM Performed at [facility], [address] [place], [place] [address]    Special Requests   Final    BOTTLES DRAWN AEROBIC AND ANAEROBIC Blood Culture adequate volume Performed at [facility], [address] [place], [place] [address]    Culture   Final    NO GROWTH < 24 HOURS Performed at [facility], [address], [place], [place] [address]    Report Status PENDING  Incomplete  Blood culture (routine x 2)     Status: None (Preliminary result)   Collection Time: 11/15/23  2:38 AM   Specimen: BLOOD RIGHT HAND  Result Value Ref Range Status   Specimen Description   Final    BLOOD RIGHT HAND Performed at [facility], [address] [place], [place] [address]    Special Requests   Final    BOTTLES DRAWN AEROBIC AND ANAEROBIC Blood Culture results may not be optimal due to an inadequate volume of blood received in culture bottles Performed at [facility], [address] [place], [place] [address]    Culture   Final    NO GROWTH < 24 HOURS Performed at [facility], [address], [place], [place] [address]    Report Status PENDING  Incomplete     Time coordinating discharge:  I have spent 35 minutes face to face with the patient and on the ward discussing the patients care, assessment, plan and disposition with other care givers. >50% of the time was devoted counseling the patient about the risks and benefits of treatment/Discharge disposition and coordinating care.   SIGNED:   Burgess JAYSON Dare, MD  [facility] 11/16/2023, 11:46 AM   If 7PM-7AM, please contact night-coverage

## 2023-11-16 NOTE — Hospital Course (Addendum)
 Brief Narrative:  48 y.o. male with medical history significant of hypertension who presents with pain and swelling of his left buttock.  CT abdomen/pelvis, which reported to show evidence of subcutaneous edema associated with right buttocks, without evidence of drainable fluid collection. Blood cultures x 2 were obtained.  Patient has been given acetaminophen , 2 L of lactated Ringer 's, vancomycin , cefepime , and fentanyl .  During the hospitalization patient received 3 doses of cefepime  and vancomycin .  He started feeling significantly better the following day with minimal swelling and drainage.  Patient would like to be discharged today with outpatient follow-up.  Have advised him to follow-up outpatient with PCP next 5 days to ensure his symptoms continue to improve.   Assessment & Plan:  Principal Problem:   Cellulitis Active Problems:   Leukocytosis   Essential hypertension, benign   Hyponatremia   Hypochloremia   Obesity (BMI 30-39.9)      Sepsis secondary to cellulitis of left buttock Sepsis physiology is improving.  CT does not show any evidence of drainable abscess or Fournier's gangrene.  Significantly improved on IV vancomycin  and cefepime .  Will transition to p.o. Bactrim  upon discharge.  Advised him to have a close outpatient follow-up with his PCP within the next 5 to 7 days.   Leukocytosis WBC elevated at 13.5.  Now resolved   Essential hypertension, uncontrolled Continue Avapro , Norvasc .    Hyponatremia Hypochloremia Resolved   Obesity BMI 37.01 kg/m   DVT prophylaxis: Lovenox   Code Status: Full Code  Family Communication:   Discharge   Subjective:  Feeling well wishing to go home  Examination:  General exam: Appears calm and comfortable  Respiratory system: Clear to auscultation. Respiratory effort normal. Cardiovascular system: S1 & S2 heard, RRR. No JVD, murmurs, rubs, gallops or clicks. No pedal edema. Gastrointestinal system: Abdomen is  nondistended, soft and nontender. No organomegaly or masses felt. Normal bowel sounds heard. Central nervous system: Alert and oriented. No focal neurological deficits. Extremities: Symmetric 5 x 5 power. Skin: Small area of swelling around his buttock area.  Much better and improved. Psychiatry: Judgement and insight appear normal. Mood & affect appropriate.

## 2023-11-16 NOTE — Progress Notes (Signed)
 Spencer Reid to be D/C'd Home per MD order.  Discussed with the patient and all questions fully answered.  IV catheter discontinued intact. Site without signs and symptoms of complications. Dressing and pressure applied.  An After Visit Summary was printed and given to the patient. Patient received prescriptions from [facility] pharmacy.  D/c education completed with patient/family including follow up instructions, medication list, d/c activities limitations if indicated, with other d/c instructions as indicated by MD - patient able to verbalize understanding, all questions fully answered.   Patient instructed to return to ED, call 911, or call MD for any changes in condition.   Patient escorted via WC, and D/C home via private auto.  Spencer Reid 11/16/2023 2:48 PM

## 2023-11-16 NOTE — TOC Transition Note (Signed)
 Transition of Care [facility]) - Discharge Note   Patient Details  Name: Spencer Reid MRN: [id number] Date of Birth: 1975/07/17  [facility] of [facility]) CM/SW Contact:  Tom-Johnson, Analea Muller Daphne, RN Phone Number: 11/16/2023, 1:29 PM   Clinical Narrative:     Patient is scheduled for discharge today.  Readmission Risk Assessment done. Hospital f/u and discharge instructions on AVS. Prescriptions sent to [facility] pharmacy and patient will receive meds prior discharge. No TOC needs or recommendations noted. Family to transport at discharge.  No further TOC needs noted.       Final next level of care: Home/Self Care Barriers to Discharge: Barriers Resolved   Patient Goals and CMS Choice Patient states their goals for this hospitalization and ongoing recovery are:: To return home CMS Medicare.gov Compare Post Acute Care list provided to:: Patient Choice offered to / list presented to : NA      Discharge Placement                Patient to be transferred to facility by: Family      Discharge Plan and Services Additional resources added to the After Visit Summary for                  DME Arranged: N/A DME Agency: NA       HH Arranged: NA HH Agency: NA        Social Drivers of Health (SDOH) Interventions SDOH Screenings   Food Insecurity: No Food Insecurity (11/15/2023)  Housing: Low Risk  (11/15/2023)  Transportation Needs: No Transportation Needs (11/15/2023)  Utilities: Not At Risk (11/15/2023)  Tobacco Use: Medium Risk (11/15/2023)     Readmission Risk Interventions    11/16/2023    1:28 PM  Readmission Risk Prevention Plan  Post Dischage Appt Complete  Medication Screening Complete  Transportation Screening Complete

## 2023-11-20 LAB — CULTURE, BLOOD (ROUTINE X 2)
Culture: NO GROWTH
Culture: NO GROWTH
Special Requests: ADEQUATE

## 2023-11-20 IMAGING — DX DG FOOT COMPLETE 3+V*R*
3 series · 3 of 3 positions shown · non-contrast
Comparison: None Available.

CLINICAL DATA: Right foot and ankle pain for 4 days

EXAM:
RIGHT FOOT COMPLETE - 3+ VIEW; RIGHT ANKLE - COMPLETE 3+ VIEW

[foot lat]
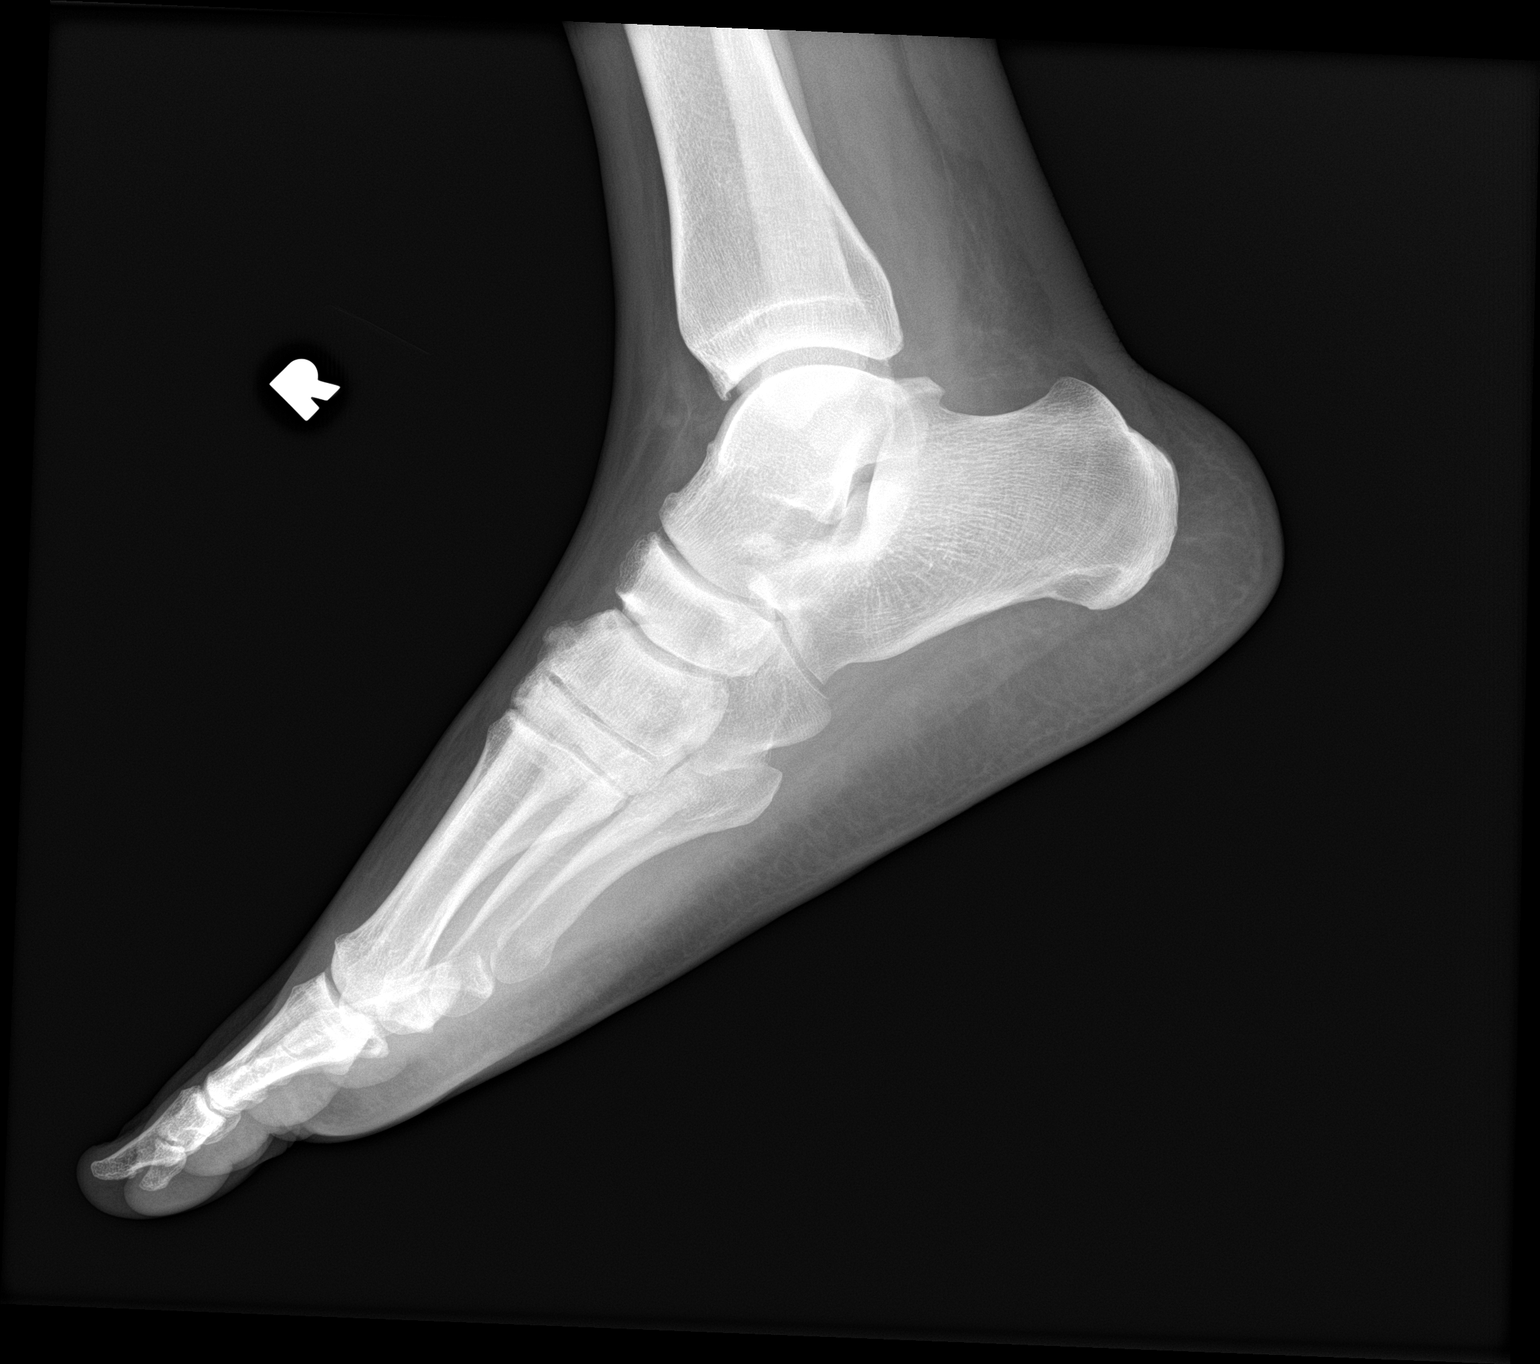

[foot ap]
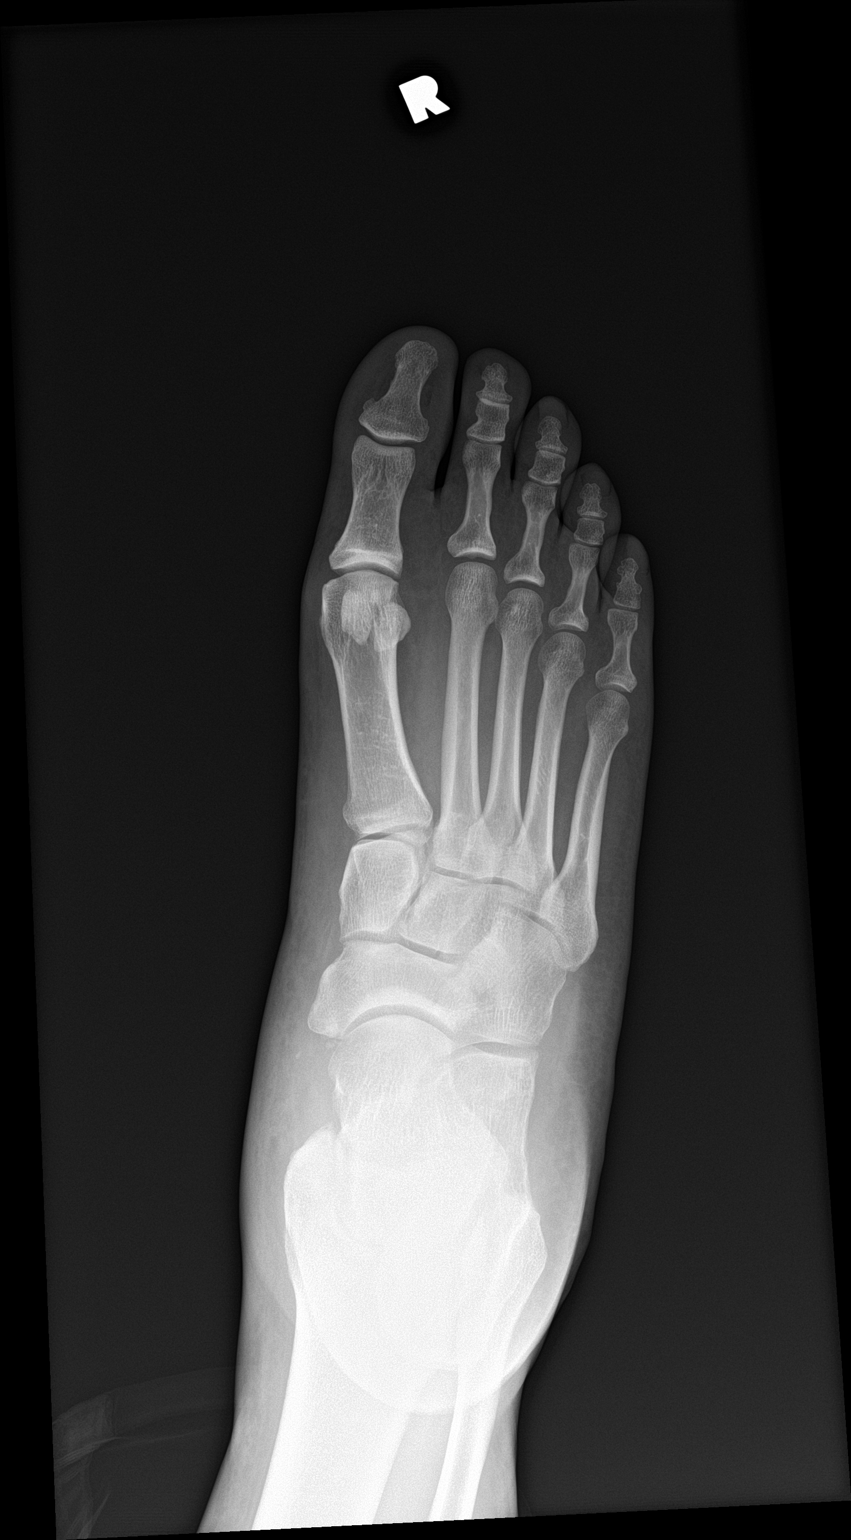

[foot obl]
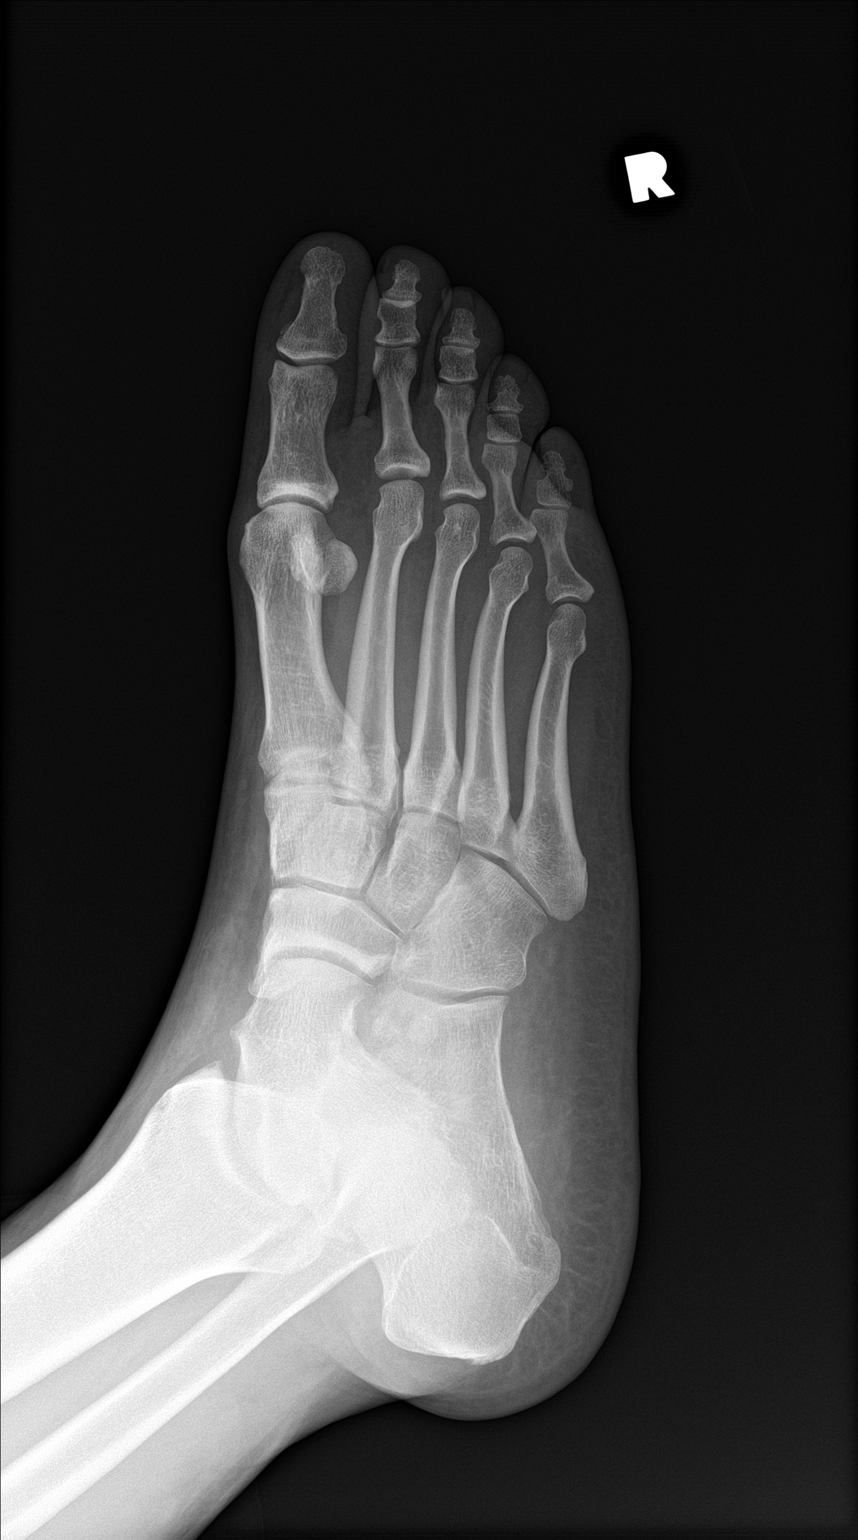

[3 of 3 positions shown; findings below may reference images not displayed]

FINDINGS: Right ankle: Frontal, oblique, and lateral views are obtained. No
acute fracture, subluxation, or dislocation. There is mild medial
soft tissue swelling. Joint spaces are relatively well preserved.

Right foot: Frontal, oblique, lateral views are obtained.
Hypertrophic changes are seen at the tarsometatarsal joints on
lateral view consistent with mild osteoarthritis. No acute fracture,
subluxation, or dislocation. Medial soft tissue swelling of the
hindfoot.
IMPRESSION: 1. Soft tissue swelling of the medial ankle and hindfoot.
2. No acute bony abnormalities.
3. Mild midfoot osteoarthritis.
